# Patient Record
Sex: Female | Born: 1985
Health system: Southern US, Community
[De-identification: ages and names within clinical notes are randomized; demographics above are authoritative.]

## PROBLEM LIST (undated history)

## (undated) ENCOUNTER — Emergency Department (HOSPITAL_BASED_OUTPATIENT_CLINIC_OR_DEPARTMENT_OTHER): Admission: EM | Discharge status: Absent without leave | Payer: 59

## (undated) HISTORY — PX: TUBAL LIGATION: SHX77

---
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
## 2004-01-17 ENCOUNTER — Other Ambulatory Visit: Admission: RE | Admit: 2004-01-17 | Discharge: 2004-01-17 | Payer: Self-pay | Admitting: Obstetrics and Gynecology

## 2004-07-03 ENCOUNTER — Inpatient Hospital Stay (HOSPITAL_COMMUNITY): Admission: AD | Admit: 2004-07-03 | Discharge: 2004-07-03 | Payer: Self-pay | Admitting: Obstetrics and Gynecology

## 2004-07-04 ENCOUNTER — Inpatient Hospital Stay (HOSPITAL_COMMUNITY): Admission: AD | Admit: 2004-07-04 | Discharge: 2004-07-07 | Payer: Self-pay | Admitting: Obstetrics and Gynecology

## 2004-08-05 ENCOUNTER — Other Ambulatory Visit: Admission: RE | Admit: 2004-08-05 | Discharge: 2004-08-05 | Payer: Self-pay | Admitting: Obstetrics & Gynecology

## 2004-11-11 ENCOUNTER — Ambulatory Visit: Payer: Self-pay | Admitting: Family Medicine

## 2005-01-21 ENCOUNTER — Ambulatory Visit: Payer: Self-pay | Admitting: Family Medicine

## 2005-03-21 ENCOUNTER — Ambulatory Visit: Payer: Self-pay | Admitting: Family Medicine

## 2005-11-19 ENCOUNTER — Other Ambulatory Visit: Admission: RE | Admit: 2005-11-19 | Discharge: 2005-11-19 | Payer: Self-pay | Admitting: Obstetrics and Gynecology

## 2005-12-08 ENCOUNTER — Ambulatory Visit (HOSPITAL_COMMUNITY): Admission: RE | Admit: 2005-12-08 | Discharge: 2005-12-08 | Payer: Self-pay | Admitting: Obstetrics and Gynecology

## 2006-03-13 ENCOUNTER — Inpatient Hospital Stay (HOSPITAL_COMMUNITY): Admission: RE | Admit: 2006-03-13 | Discharge: 2006-03-15 | Payer: Self-pay | Admitting: Obstetrics and Gynecology

## 2006-03-13 ENCOUNTER — Encounter (INDEPENDENT_AMBULATORY_CARE_PROVIDER_SITE_OTHER): Payer: Self-pay | Admitting: Specialist

## 2006-06-08 ENCOUNTER — Telehealth (INDEPENDENT_AMBULATORY_CARE_PROVIDER_SITE_OTHER): Payer: Self-pay | Admitting: *Deleted

## 2006-06-09 ENCOUNTER — Ambulatory Visit: Payer: Self-pay | Admitting: Family Medicine

## 2006-12-17 ENCOUNTER — Ambulatory Visit: Payer: Self-pay | Admitting: Obstetrics & Gynecology

## 2006-12-21 ENCOUNTER — Ambulatory Visit (HOSPITAL_COMMUNITY): Admission: RE | Admit: 2006-12-21 | Discharge: 2006-12-21 | Payer: Self-pay | Admitting: Obstetrics & Gynecology

## 2007-01-15 ENCOUNTER — Ambulatory Visit (HOSPITAL_COMMUNITY): Admission: RE | Admit: 2007-01-15 | Discharge: 2007-01-15 | Payer: Self-pay | Admitting: Obstetrics & Gynecology

## 2007-02-10 ENCOUNTER — Ambulatory Visit: Payer: Self-pay | Admitting: Obstetrics & Gynecology

## 2007-02-10 ENCOUNTER — Encounter: Payer: Self-pay | Admitting: Obstetrics and Gynecology

## 2007-02-12 ENCOUNTER — Ambulatory Visit (HOSPITAL_COMMUNITY): Admission: RE | Admit: 2007-02-12 | Discharge: 2007-02-12 | Payer: Self-pay | Admitting: Obstetrics & Gynecology

## 2007-03-03 ENCOUNTER — Ambulatory Visit: Payer: Self-pay | Admitting: *Deleted

## 2007-03-10 ENCOUNTER — Ambulatory Visit: Payer: Self-pay | Admitting: Obstetrics & Gynecology

## 2007-03-17 ENCOUNTER — Ambulatory Visit: Payer: Self-pay | Admitting: Obstetrics & Gynecology

## 2007-03-23 ENCOUNTER — Ambulatory Visit: Payer: Self-pay | Admitting: Gynecology

## 2007-03-23 ENCOUNTER — Encounter (INDEPENDENT_AMBULATORY_CARE_PROVIDER_SITE_OTHER): Payer: Self-pay | Admitting: Gynecology

## 2007-03-23 ENCOUNTER — Inpatient Hospital Stay (HOSPITAL_COMMUNITY): Admission: RE | Admit: 2007-03-23 | Discharge: 2007-03-26 | Payer: Self-pay | Admitting: Gynecology

## 2007-03-31 ENCOUNTER — Telehealth: Payer: Self-pay | Admitting: *Deleted

## 2007-04-16 LAB — CONVERTED CEMR LAB
Pap Smear: NORMAL
Pap Smear: NORMAL
Pap Smear: NORMAL

## 2007-10-19 ENCOUNTER — Ambulatory Visit: Payer: Self-pay | Admitting: Family Medicine

## 2007-10-19 DIAGNOSIS — R300 Dysuria: Secondary | ICD-10-CM

## 2007-10-19 LAB — CONVERTED CEMR LAB
Bilirubin Urine: NEGATIVE
Glucose, Urine, Semiquant: NEGATIVE
Ketones, urine, test strip: NEGATIVE
Specific Gravity, Urine: 1.025
Urobilinogen, UA: 0.2
pH: 6

## 2007-12-24 ENCOUNTER — Ambulatory Visit: Payer: Self-pay | Admitting: Family Medicine

## 2007-12-24 LAB — CONVERTED CEMR LAB
Casts: 0 /lpf
Glucose, Urine, Semiquant: NEGATIVE
Nitrite: NEGATIVE
Protein, U semiquant: NEGATIVE
Urobilinogen, UA: 0.2

## 2008-10-20 ENCOUNTER — Emergency Department (HOSPITAL_COMMUNITY): Admission: EM | Admit: 2008-10-20 | Discharge: 2008-10-20 | Payer: Self-pay | Admitting: Emergency Medicine

## 2009-02-04 ENCOUNTER — Emergency Department (HOSPITAL_COMMUNITY): Admission: EM | Admit: 2009-02-04 | Discharge: 2009-02-04 | Payer: Self-pay | Admitting: Emergency Medicine

## 2009-07-26 IMAGING — US US OB FOLLOW-UP
1 series · 14 of 28 positions shown · non-contrast
Comparison: none

OBSTETRICAL ULTRASOUND:
 This ultrasound was performed in The [HOSPITAL], and the [facility] report will be stored to [REDACTED] PACS.

[Series 1: us ob follow-up · 14 of 30 slices shown]
[im 2/30]
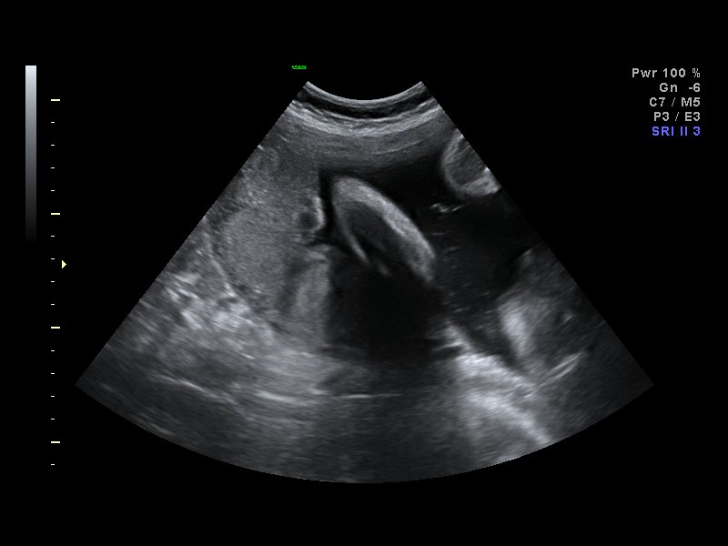
[im 4/30]
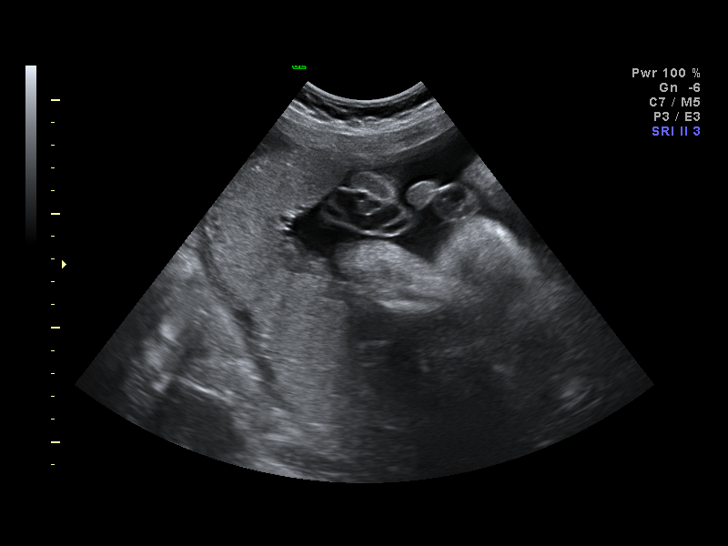
[im 6/30]
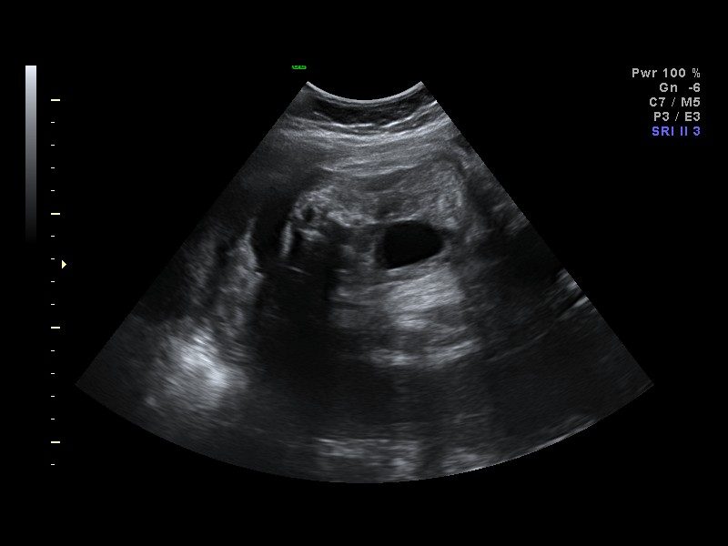
[im 8/30]
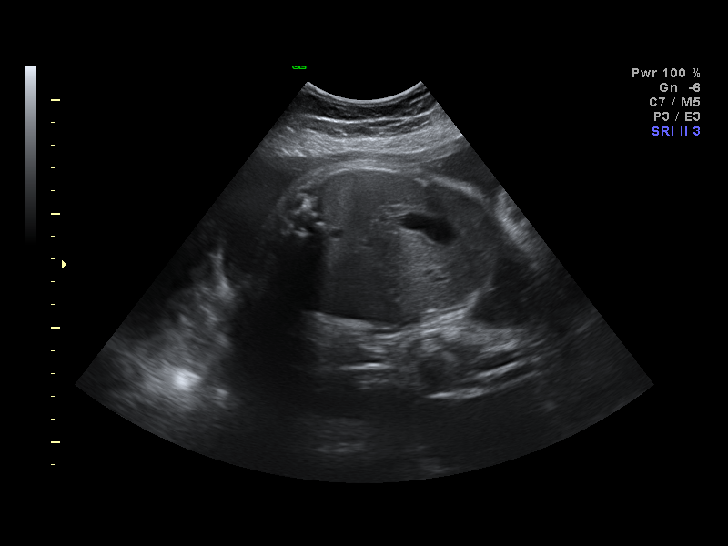
[im 10/30]
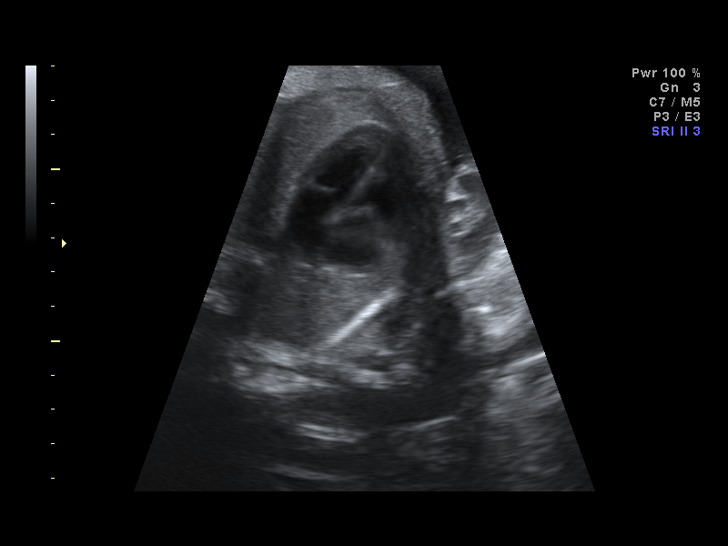
[im 12/30]
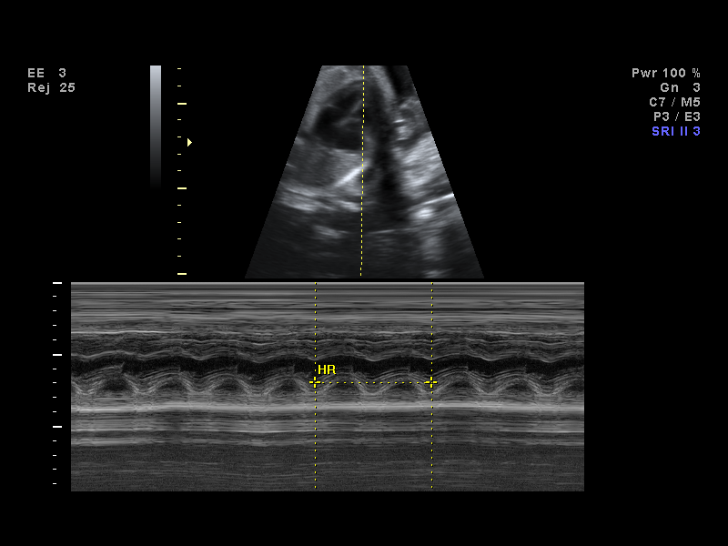
[im 14/30]
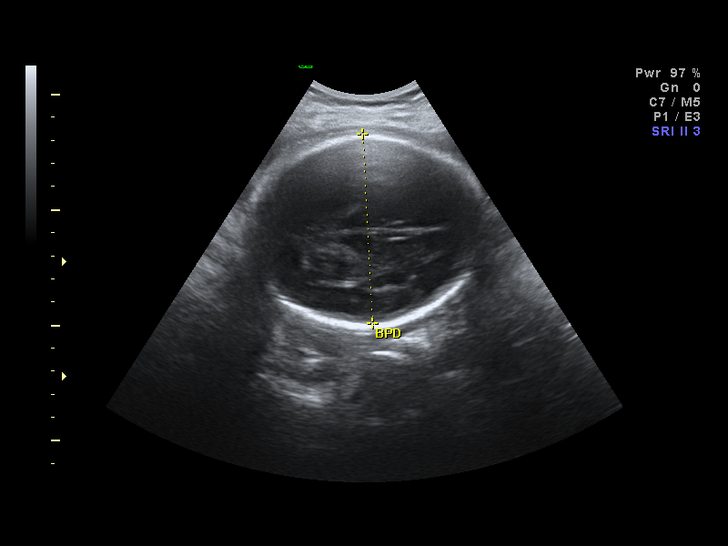
[im 17/30]
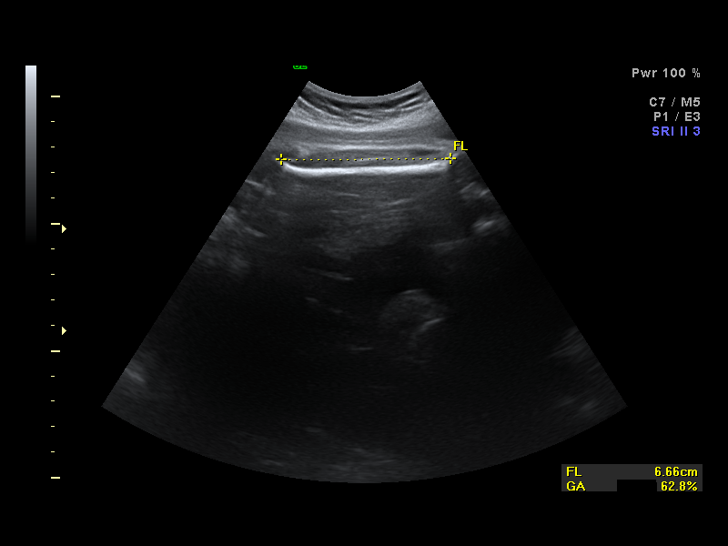
[im 19/30]
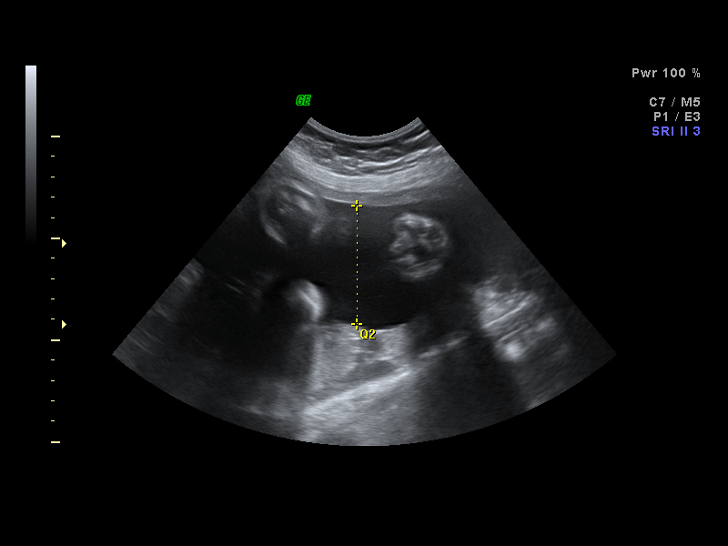
[im 21/30]
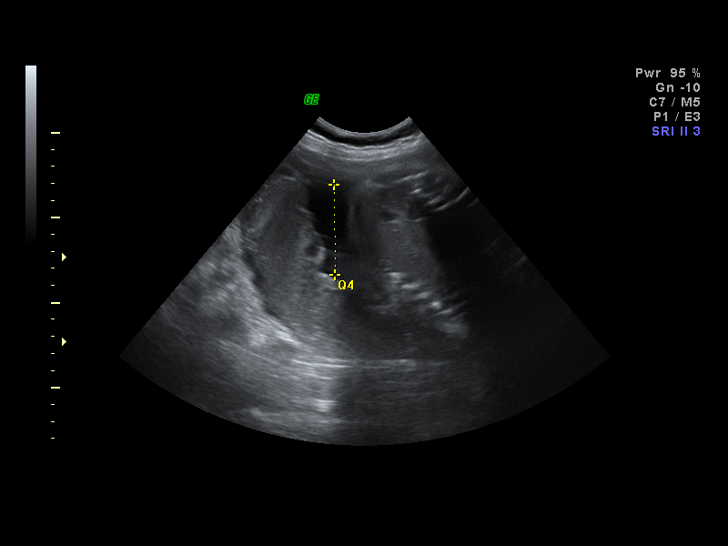
[im 23/30]
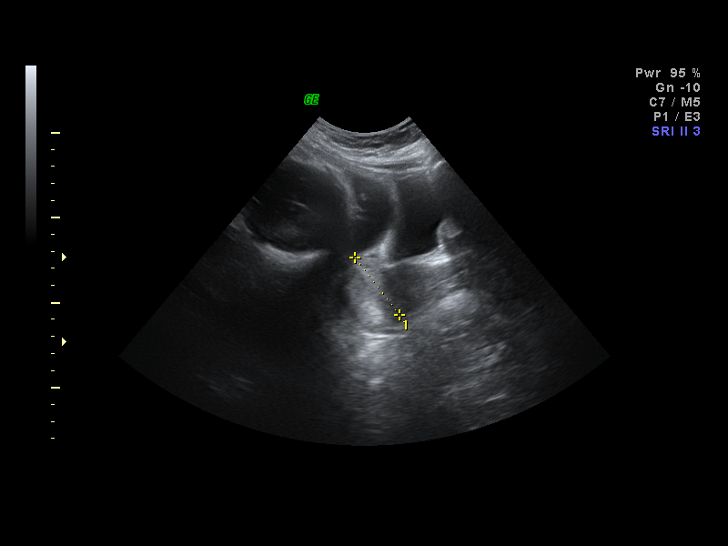
[im 25/30]
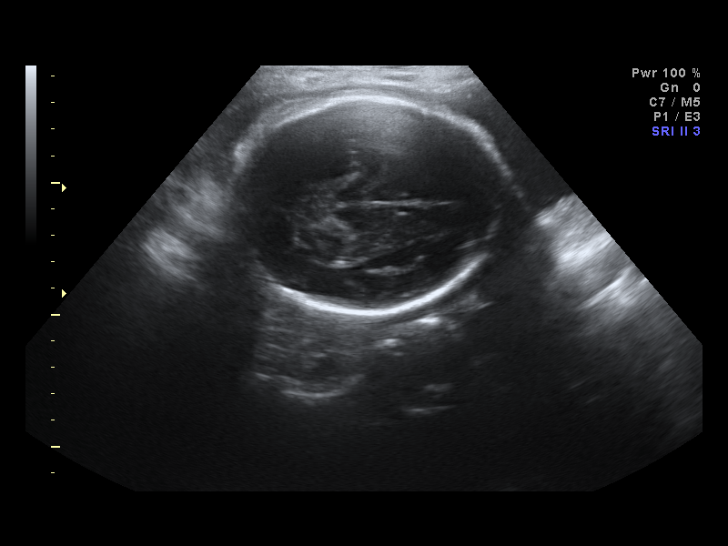
[im 27/30]
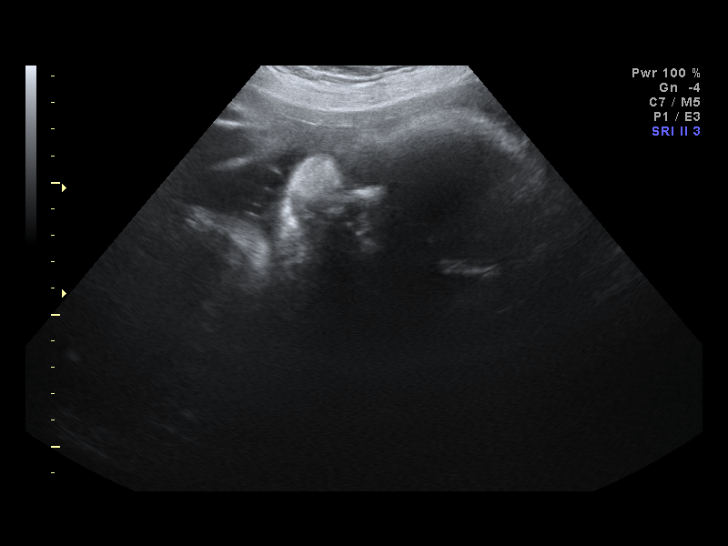
[im 30/30]
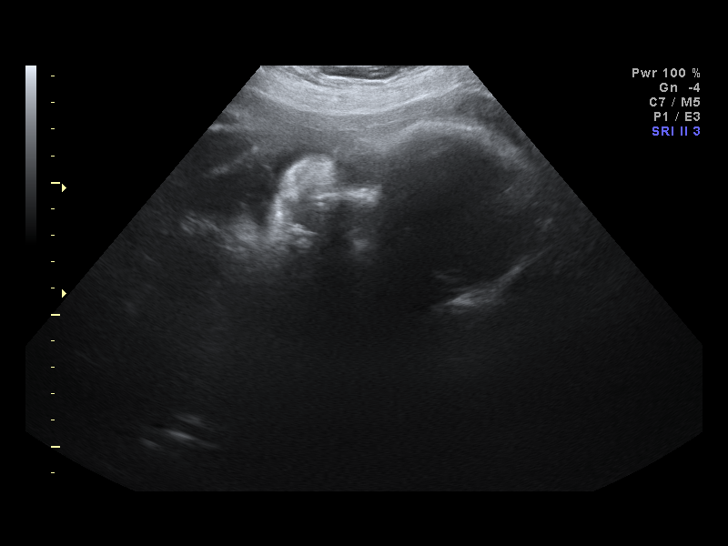

[14 of 28 positions shown; findings below may reference images not displayed]

IMPRESSION: The [facility] report has also been faxed to the ordering physician.

## 2010-03-03 ENCOUNTER — Encounter: Payer: Self-pay | Admitting: *Deleted

## 2010-03-21 ENCOUNTER — Encounter: Payer: Self-pay | Admitting: *Deleted

## 2010-05-17 LAB — POCT RAPID STREP A (OFFICE): Streptococcus, Group A Screen (Direct): POSITIVE — AB

## 2010-06-25 NOTE — Op Note (Signed)
Debra Rosario, Debra Rosario NO.:  [id number]   MEDICAL RECORD NO.:  [id number]          PATIENT TYPE:  WOC   LOCATION:  WOC                          FACILITY:  [facility]   PHYSICIAN:  Ginger Carne, MD  DATE OF BIRTH:  [birth year]   DATE OF PROCEDURE:  03/23/2007  DATE OF DISCHARGE:                               OPERATIVE REPORT   PREOPERATIVE DIAGNOSIS:  Repeat cesarean section and request for  sterilization.   POSTOPERATIVE DIAGNOSIS:  Repeat cesarean section and request for  sterilization.   PROCEDURE:  Repeat low transverse cesarean section and [place] bilateral  tubal ligation.   SURGEON:  Ginger Carne, MD.   ASSISTANTMichele Mcalpine D. Rose, M.D.   ESTIMATED BLOOD LOSS:  700 mL.   COMPLICATIONS:  None immediate.   ANESTHESIA:  Spinal.   SPECIMEN:  Cord bloods and right and left portions of tubes.   FINDINGS:  A term infant female delivered in vertex presentation.  Apgars  and weight per delivery room record, no gross abnormalities.  Baby cried  spontaneously at delivery.  Uterus, tubes and ovaries showed normal  decidual changes of pregnancy.  Placenta was complete, three-vessel  cord, central insertion.   OPERATIVE PROCEDURE:  The patient prepped and draped in usual fashion  and placed in the left lateral supine position.  Betadine solution used  for antiseptic and the patient was catheterized prior to procedure.  After adequate spinal analgesia a Pfannenstiel incision was made and  abdomen opened.  Lower uterine segment incised transversely after  developing the bladder flap.  Baby delivered, cord clamped and cut and  infant given to the pediatric staff after bulb suctioning.  Placenta  removed manually.  Uterus inspected.  Closure uterine musculature in one  layer of 0-0 Vicryl running interlocking suture.  Bleeding points  hemostatically checked.  Blood clots removed.   The tubes were bilaterally identified separate and apart from their  respective  round ligaments.  3 cm of tube were grasped at the isthmus  ampullary junction and 2-0 plain catgut sutures were placed at the base  twice and the tubes  were cut above said knots.  Specimen sent separately to pathology.  No  active bleeding noted.  Closure of the fascia in one layer of 0-0 Vicryl  running suture and skin staples for the skin.  Instrument and sponge  count were correct.  The patient tolerated the procedure well, returned  post anesthesia recovery room in excellent condition.      Ginger Carne, MD  Electronically Signed     SHB/MEDQ  D:  03/23/2007  T:  03/24/2007  Job:  [id number]

## 2010-06-25 NOTE — Discharge Summary (Signed)
NAMESHYNICE, SIGEL                ACCOUNT NO.:  [id number]   MEDICAL RECORD NO.:  [id number]          PATIENT TYPE:  INP   LOCATION:  9106                          [facility]   PHYSICIAN:  Lazaro Arms, M.D.   DATE OF BIRTH:  22-Apr-1985   DATE OF ADMISSION:  03/23/2007  DATE OF DISCHARGE:  03/26/2007                               DISCHARGE SUMMARY   ADMITTING DIAGNOSIS:  Planned repeat cesarean section and bilateral  tubal ligation.   DISCHARGE DIAGNOSIS:  Planned repeat cesarean section and bilateral  tubal ligation.   HOSPITAL COURSE:  The patient was admitted on February 10 for a planned  repeat cesarean section and bilateral tubal ligation at [redacted] weeks  gestation.  Her operative course was uneventful without complication.  Vital signs remained within normal limits.  Blood count is normal except  for some anemia with a hemoglobin of 8.7 and a hematocrit of 25.8.  She  is asymptomatic.   PHYSICAL EXAMINATION:  HEENT:  Within normal limits.  HEART:  Regular rate and rhythm.  LUNGS:  Were clear.  ABDOMEN:  Is soft and nontender.  She is passing flatus, bowel sounds  positive x4 quadrants, staples intact.  Incision is clean and dry  without erythema or drainage.  Legs are negative.  Lochia small.   The patient is breast-feeding.   IMPRESSION:  Postoperative day #3 status post repeat low transverse  cesarean section and BTL.   PLAN:  Remove staples before discharge.   DISCHARGE MEDICATIONS:  1. FeSO4 325 mg p.o. b.i.d.  2. Prenatal vitamins 1 p.o. daily.  3. Ibuprofen 600 mg 1 p.o. q.6h. as needed for pain.  4. Colace 100 mg 1 p.o. b.i.d. as needed for postpartum constipation.  5. Percocet 5/325 one p.o. q.4-6h. p.r.n. pain #20, no refills.     Jacklyn Shell, C.N.M.      Lazaro Arms, M.D.  Electronically Signed   FC/MEDQ  D:  03/26/2007  T:  03/27/2007  Job:  [id number]

## 2010-06-28 NOTE — Discharge Summary (Signed)
Debra Rosario, AUTHIER                ACCOUNT NO.:  [id number]   MEDICAL RECORD NO.:  [id number]          PATIENT TYPE:  INP   LOCATION:  9133                          [facility]   PHYSICIAN:  Ilda Mori, M.D.   DATE OF BIRTH:  04-Nov-1985   DATE OF ADMISSION:  07/04/2004  DATE OF DISCHARGE:  07/07/2004                                 DISCHARGE SUMMARY   FINAL DIAGNOSES:  Intrauterine pregnancy at term, active labor.  Decreased  amniotic fluid volume with an unengaged vertex at term and a primigravida as  labor failure to progress.  Meconium stained amniotic fluid.   PROCEDURES:  Low transverse cesarean section.   SURGEON:  Gerrit Friends. Aldona Bar, M.D.   COMPLICATIONS:  None.   HISTORY OF PRESENT ILLNESS:  This 25 -year-old G1, P0 as admitted at 40 plus  weeks gestation in early labor.  The patient had a positive Group B strep  culture.  Came in the office.  Was started on some antibiotics.  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  At this point, the patient as in  early labor.  She was admitted on the morning of May 25, when her cervix was  about 2 cm dilated, 50% effaced, and the vertex was totally unengaged.  She  did not wish Pitocin augmentation at this time, and vertex was too high for  amniotomy.  The patient was observed for a couple of hours until around noon  where the AFI was reported to be at about 8.3.  The BPP on May 24 was 8/8,  and this was done for a nonreassuring NST, but today, the amniotic fluid was  8.7, and on reevaluation of the cervix, there was no change.  After a long  discussion with the patient, it is decided to begin low dose Pitocin.  At  about 6 p.m., the patient's cervix was still about 2 cm dilated, about 70%  effaced and the vertex was still very high.  The patient was contracting  pretty  well in the Pitocin.  A discussion was held with the patient  regarding her option, and at this point a decision was made to proceed with  a cesarean section secondary to an unengaged primigravida at term, failure  to progress and decreased amniotic fluid.  The patient was taken to the  operating room on Jul 04, 2004, by Dr. Annamaria Helling where a primary low  transverse cesarean section was performed with delivery of a 6 pound 7 ounce  female infant with Apgars of 8 and 9.  Delivery without complications.  Patient's postoperative course was benign without significant fevers.  She  was felt ready for discharge on postoperative day #3.  She was sent home on  a regular diet, told to decrease activities.  Told to  continue her prenatal vitamins.  Was given Tylox #25 1-2 q.4h. p.r.n. pain.  Told she could use over-the-counter Motrin up  to 600 mg q.6h. p.r.n. pain.  Was to follow up in the office in four weeks.   DISCHARGE LABORATORIES:  Hemoglobin 10.3, white blood cell count 10.8.       MB/MEDQ  D:  07/22/2004  T:  07/22/2004  Job:  [id number]

## 2010-06-28 NOTE — Op Note (Signed)
Debra, Rosario                ACCOUNT NO.:  [id number]   MEDICAL RECORD NO.:  [id number]          PATIENT TYPE:  INP   LOCATION:  9133                          [facility]   PHYSICIAN:  Gerrit Friends. Aldona Bar, M.D.   DATE OF BIRTH:  1985-07-03   DATE OF PROCEDURE:  07/04/2004  DATE OF DISCHARGE:                                 OPERATIVE REPORT   PREOPERATIVE DIAGNOSES:  1.  Term pregnancy, labor with decreased amniotic fluid volume, unengaged      vertex at term.  2.  Primigravida in labor.  3.  Failure to progress.   POSTOPERATIVE DIAGNOSES:  1.  Term pregnancy, labor with decreased amniotic fluid volume, unengaged      vertex at term.  2.  Primigravida in labor.  3.  Failure to progress.  4.  Delivery of 6 pounds 7 ounce female infant, Apgars 8 and 9.  5.  Thin meconium-stained amniotic fluid.   PROCEDURE:  Primary low transverse cesarean section.   SURGEON:  Gerrit Friends. Aldona Bar, M.D.   ANESTHESIA:  Spinal, Dr. Arby Barrette.   HISTORY:  This 25 year old gravida 1, para 0, was admitted at 40+ weeks in  possible early labor.  Her strep culture was positive.  Her pregnancy was  benign with the exception of excess weight gain.  When I examined the  patient at 8 a.m. on the morning of May 25, her cervix was 1-2 cm dilated,  50% effaced and the vertex was totally unengaged. -3 to -4 station.  It was  difficult to feel the cervix until the patient actually had emptied her  bladder, and then it was still difficult but possible.  The patient did not  wish Pitocin augmentation at this time, and the vertex was too high to carry  out amniotomy.  The patient was observed for three to four hours and  reevaluated at around noon.  An AFI was done and reported at 8.3.  Apparently the patient had a biophysical profile on May 24 for a nonreactive  nonstress test in the office and the biophysical profile was 8/8, but the  amniotic fluid volume was noted to be 8.7, believe.  On reevaluation of the  patient's cervix, there was no real change.  The vertex was still  essentially floating.  After a long discussion with the patient, it was  decided to begin low-dose Pitocin and continue observation.  I was reluctant  to send  her home because of the decreased amniotic fluid volume. When  reassessed at approximately 6 p.m. on May 25, her cervix was now 2 cm  dilated, had thinned out to about 70% effacement, but the vertex was still  very high and the patient was contracting fairly well with Pitocin.  A long  discussion was carried out with the patient and her family at this time and  after all options were given and discussed, the patient opted for cesarean  section for delivery.  She is now taken to the operating room for cesarean  section with the primary diagnosis being an unengaged primigravida at term,  failure to  progress, and decreased amniotic fluid volume.   PROCEDURE:  The patient was taken to the operating room, where after  satisfactory induction of spinal anesthetic by Dr. Arby Barrette she was prepped  and draped, having been placed in the supine position slightly tilted to the  left.  A Foley catheter was placed as part of the prep.  After the patient  was adequately draped and good anesthetic levels were documented, the  procedure was begun.  A Pfannenstiel incision was made with minimal  difficulty dissected down sharply to and through the fascia in a low  transverse fashion with hemostasis created each layer.  The fascia was  incised in the low transverse fashion, subfascial space was created  inferiorly and superiorly, muscles separated in the midline, and the  peritoneum identified and entered appropriately with care taken to avoid the  bowel superiorly and the bladder inferiorly.  At this time the vesicouterine  peritoneum was incised a low transverse fashion and pushed off the lower  uterine segment with ease.  Sharp to the uterus at this time was carried out  in a low  transverse fashion with the Metzenbaum scissors.  Amniotomy  produced with production of thin meconium.  At this time the vertex was  still floating but with minimal difficulty was delivered, as was the baby,  subsequent weight found to be 6 pounds 7 ounces and Apgars were assigned at  8 and 9.  The infant was subsequently taken to the nursery in good condition  and after the cord was clamped and cut and cord bloods were collected, the  placenta was delivered intact.  There was a three-vessel cord.  At this time  the uterus was exteriorized, rendered free of any remaining products  conception, in good uterine contractility was afforded with slowly-given  intravenous Pitocin and manual stimulation.  Tubes and ovaries appeared  normal.  At this time the uterus was closed in two layers, the first being  #1 Vicryl in a running locking fashion, the second being #1 Vicryl in an  interrupted figure-of-eight fashion.  The bladder flap was reapproximated  using 0 Vicryl in interrupted fashion, and at this time the abdomen lavaged  of all free blood and clot in the uterus placed into the abdominal cavity.  All counts were noted to be correct at this time and no foreign bodies were  noted to be remaining in the abdominal cavity.  Closure of the abdomen was carried out at this time in layers.  The  abdominal peritoneum was closed with 0 Vicryl in a running fashion, as was  were the muscles reapproximated.  Assured of good subfascial hemostasis, the  fascia was then reapproximated using 0 Vicryl from angle to midline  bilaterally.  Subcutaneous tissue was rendered hemostatic and staples were  then used to close the skin.  A sterile pressure dressing was applied and the patient at this time of  transported to the recovery room in satisfactory condition, having tolerated  the procedure well.  Estimated blood loss 510 mL.  All counts correct x2.  In summary, this patient presented as an unengaged  primigravida at term in  labor and did not progress in descent and opted for delivery by cesarean  section after discussion of all the options.  She was delivered of a 6  pounds 7 ounce female infant with Apgars of 8 and 9.  There was meconium-  stained amniotic fluid, and the amniotic fluid was reduced.  At the  conclusion  of the procedure, both mother and baby were doing well in their  respective recovery areas.      RMW/MEDQ  D:  07/04/2004  T:  07/05/2004  Job:  [id number]

## 2010-06-28 NOTE — Discharge Summary (Signed)
NAMEMARYHELEN, LINDLER                ACCOUNT NO.:  [id number]   MEDICAL RECORD NO.:  [id number]          PATIENT TYPE:  INP   LOCATION:  9130                          [facility]   PHYSICIAN:  James A. Ashley Royalty, M.D.DATE OF BIRTH:  [birth year]   DATE OF ADMISSION:  03/13/2006  DATE OF DISCHARGE:  03/15/2006                               DISCHARGE SUMMARY   DISCHARGE DIAGNOSES:  1. Intrauterine pregnancy at 39 weeks, delivered.  2. Previous cesarean section.   OPERATIONS/SPECIAL PROCEDURES:  Repeat low transverse cesarean section.   CONSULTATIONS:  None.   DISCHARGE MEDICATIONS:  Percocet, Motrin 600 mg, vitamins.   HISTORY AND PHYSICAL:  This is a 25 year old, gravida 2, para 1, at [redacted]  weeks gestation.  Previous cesarean section.  Noncompliance with OB/GYN  care.  Patient had Chlamydia positive early in the pregnancy, which was  successfully treated.  For the remainder of the history, please see the  chart.   HOSPITAL COURSE:  The patient was admitted to [facility]  [place].  Admission laboratory studies were drawn.  On March 13, 2006, she was taken to the operating room and underwent repeat low  transverse cesarean section.  The procedure yielded a 6 pound, 12 ounce  female, Apgar's 9 at one minute and 9 at five minutes, sent to the newborn  nursery.  The patient's postoperative course was benign.  At her  request, she was discharged on the second postoperative day afebrile and  in satisfactory condition.   DISPOSITION:  Patient is to return to the office in the next one to two  days to remove residual staples and, of course, in six weeks for her  postpartum visit.      James A. Ashley Royalty, M.D.  Electronically Signed     JAM/MEDQ  D:  03/15/2006  T:  03/15/2006  Job:  [id number]

## 2010-06-28 NOTE — Op Note (Signed)
Debra Rosario, Debra Rosario                ACCOUNT NO.:  [id number]   MEDICAL RECORD NO.:  [id number]          PATIENT TYPE:  INP   LOCATION:  9130                          [facility]   PHYSICIAN:  James A. Ashley Royalty, M.D.DATE OF BIRTH:  [birth year]   DATE OF PROCEDURE:  03/13/2006  DATE OF DISCHARGE:                               OPERATIVE REPORT   PREOPERATIVE DIAGNOSES:  1. Intrauterine pregnancy at 48 weeks' gestation.  2. Previous cesarean section.   POSTOPERATIVE DIAGNOSES:  1. Intrauterine pregnancy at 74 weeks' gestation.  2. Previous cesarean section.   PROCEDURE:  Repeat low transverse cesarean section.   SURGEON:  Rudy Jew. Ashley Royalty, M.D.   ANESTHESIA:  Spinal.   FINDINGS:  A 6 pound 12 ounce female, Apgars 9 at one minute and 9 at five  minutes, sent to the newborn nursery.   ESTIMATED BLOOD LOSS:  700 mL.   COMPLICATIONS:  None.   PACKS DRAINS:  Foley.   Sponge, needle and instrument count were reported as correct x2.   PROCEDURE:  The patient was taken to the operating room and  placed in a  sitting position.  After a spinal anesthetic was administered, she was  placed in the dorsal supine position and prepped and draped in the usual  manner for abdominal surgery.  A Foley catheter was placed.  A  Pfannenstiel incision was made down to level the fascia, which was  nicked with a knife, incised transversely with Mayo scissors.  The  underlying rectus muscles were separated from the fascia using sharp and  blunt dissection.  The rectus muscles were separated in the midline  exposing the peritoneum, which was elevated with hemostats and entered  atraumatically with Metzenbaum scissors.  The incision was extended  longitudinally.  The uterus was identified and a bladder flap created by  incising the anterior uterine serosa and sharply and bluntly dissecting  the bladder inferiorly.  It was held in place with a bladder blade.  The  uterus was then entered through a low  transverse incision using sharp  and blunt dissection.  The fluid was noted to be clear.  The infant was  delivered from a vertex presentation in an atraumatic manner.  There was  a nuchal cord x1.  The infant was suctioned.  The cord was doubly  clamped, cut, and the infant given immediately to the awaiting  pediatrics team.  Next the placenta and membranes were removed in their  entirety and submitted to pathology for histologic studies.  The uterus  was exteriorized.  The uterus was then closed in two running layers with  #1 Vicryl.  The first was a running locking layer.  The second was a  running, intermittently locking, and imbricating layer.  One additional  figure-of-eight suture was required to obtain hemostasis.  Hemostasis  was noted.  The uterus, tubes and ovaries were inspected and found to be  normal and were returned to the abdominal cavity.  Irrigation was  accomplished and hemostasis once again noted.  The peritoneum was then  closed with 3-0 Vicryl in a running fashion.  The deep fascia was closed  with 0 Vicryl in a running fashion.  The skin was closed with staples.  At the conclusion of the procedure, the urine was clear and copious.   The patient was then taken to the recovery room in excellent condition.      James A. Ashley Royalty, M.D.  Electronically Signed     JAM/MEDQ  D:  03/13/2006  T:  03/13/2006  Job:  [id number]

## 2010-10-31 LAB — POCT URINALYSIS DIP (DEVICE)
Glucose, UA: NEGATIVE
Glucose, UA: NEGATIVE
Ketones, ur: NEGATIVE
Ketones, ur: NEGATIVE
Operator id: 120861
Operator id: 297281
Protein, ur: NEGATIVE
Protein, ur: NEGATIVE
Specific Gravity, Urine: 1.02
Specific Gravity, Urine: 1.02
Urobilinogen, UA: 0.2

## 2010-11-01 LAB — CBC
Platelets: 190
Platelets: 229
RBC: 4.06
RDW: 13.9
WBC: 11.6 — ABNORMAL HIGH
WBC: 12.2 — ABNORMAL HIGH

## 2010-11-01 LAB — POCT URINALYSIS DIP (DEVICE)
Glucose, UA: NEGATIVE
Ketones, ur: NEGATIVE
Operator id: 148111
Protein, ur: 30 — AB
Specific Gravity, Urine: 1.02
Urobilinogen, UA: 0.2

## 2010-11-01 LAB — TYPE AND SCREEN
ABO/RH(D): O POS
Antibody Screen: NEGATIVE

## 2010-11-01 LAB — RPR: RPR Ser Ql: NONREACTIVE

## 2010-11-15 LAB — POCT URINALYSIS DIP (DEVICE)
Bilirubin Urine: NEGATIVE
Glucose, UA: NEGATIVE
Ketones, ur: NEGATIVE
Nitrite: NEGATIVE
Operator id: 297281
pH: 7

## 2012-04-17 ENCOUNTER — Encounter (HOSPITAL_BASED_OUTPATIENT_CLINIC_OR_DEPARTMENT_OTHER): Payer: Self-pay

## 2012-04-17 DIAGNOSIS — R079 Chest pain, unspecified: Secondary | ICD-10-CM | POA: Insufficient documentation

## 2012-04-17 MED ORDER — ASPIRIN 81 MG PO CHEW: 162.0000 mg | CHEWABLE_TABLET | Freq: Once | ORAL | Status: DC

## 2012-04-17 NOTE — ED Notes (Signed)
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 Reports that she has been seen in past for same and no diagnosis made. Denies cold and cough symptoms

## 2012-04-18 ENCOUNTER — Emergency Department (HOSPITAL_BASED_OUTPATIENT_CLINIC_OR_DEPARTMENT_OTHER): Admit: 2012-04-18 | Payer: Self-pay

## 2012-04-18 ENCOUNTER — Emergency Department (HOSPITAL_BASED_OUTPATIENT_CLINIC_OR_DEPARTMENT_OTHER)
Admission: EM | Admit: 2012-04-18 | Discharge: 2012-04-18 | Discharge status: Against Medical Advice | Payer: Medicaid Other | Attending: Emergency Medicine | Admitting: Emergency Medicine

## 2012-04-18 NOTE — ED Notes (Signed)
Patient called back to triage room to have labs drawn as ordered. Patient refused and signed out AMA

## 2014-05-16 ENCOUNTER — Other Ambulatory Visit (HOSPITAL_COMMUNITY)
Admission: RE | Admit: 2014-05-16 | Discharge: 2014-05-16 | Disposition: A | Discharge status: Home / Self Care | Payer: 59 | Source: Ambulatory Visit | Attending: Family Medicine | Admitting: Family Medicine

## 2014-05-16 ENCOUNTER — Other Ambulatory Visit: Payer: Self-pay | Admitting: Physician Assistant

## 2014-05-16 DIAGNOSIS — Z124 Encounter for screening for malignant neoplasm of cervix: Secondary | ICD-10-CM | POA: Insufficient documentation

## 2014-05-18 LAB — CYTOLOGY - PAP

## 2015-07-23 ENCOUNTER — Emergency Department (HOSPITAL_BASED_OUTPATIENT_CLINIC_OR_DEPARTMENT_OTHER)
Admission: EM | Admit: 2015-07-23 | Discharge: 2015-07-23 | Disposition: A | Discharge status: Home / Self Care | Payer: 59 | Attending: Emergency Medicine | Admitting: Emergency Medicine

## 2015-07-23 ENCOUNTER — Encounter (HOSPITAL_BASED_OUTPATIENT_CLINIC_OR_DEPARTMENT_OTHER): Payer: Self-pay | Admitting: Emergency Medicine

## 2015-07-23 DIAGNOSIS — N390 Urinary tract infection, site not specified: Secondary | ICD-10-CM | POA: Insufficient documentation

## 2015-07-23 LAB — URINALYSIS, ROUTINE W REFLEX MICROSCOPIC
Bilirubin Urine: NEGATIVE
Glucose, UA: NEGATIVE mg/dL
Hgb urine dipstick: NEGATIVE
KETONES UR: NEGATIVE mg/dL
NITRITE: POSITIVE — AB
PH: 6 (ref 5.0–8.0)
Protein, ur: NEGATIVE mg/dL
Specific Gravity, Urine: 1.018 (ref 1.005–1.030)

## 2015-07-23 LAB — URINE MICROSCOPIC-ADD ON

## 2015-07-23 MED ORDER — CEPHALEXIN 500 MG PO CAPS: 500.0000 mg | ORAL_CAPSULE | Freq: Four times a day (QID) | ORAL | Status: DC

## 2015-07-23 MED FILL — CEPHALEXIN 500 MG CAPSULE: 500 | 10 days supply | Qty: 40 | Fill #0

## 2015-07-23 NOTE — Discharge Instructions (Signed)

## 2015-07-23 NOTE — ED Provider Notes (Signed)
CSN: [id number]     Arrival date & time 07/23/15  1023 History   First MD Initiated Contact with Patient 07/23/15 1025     Chief Complaint  Patient presents with  . Urinary Frequency     (Consider location/radiation/quality/duration/timing/severity/associated sxs/prior Treatment) Patient is a 30 y.o. female presenting with frequency. The history is provided by the patient. No language interpreter was used.  Urinary Frequency This is a new problem. The current episode started today. The problem occurs constantly. The problem has been gradually worsening. Nothing aggravates the symptoms. She has tried nothing for the symptoms.    History reviewed. No pertinent past medical history. History reviewed. No pertinent past surgical history. History reviewed. No pertinent family history. Social History  Substance Use Topics  . Smoking status: Never Smoker   . Smokeless tobacco: None  . Alcohol Use: Yes     Comment: occ   OB History    No data available     Review of Systems  Genitourinary: Positive for frequency.  All other systems reviewed and are negative.     Allergies  Review of patient's allergies indicates no known allergies.  Home Medications   Prior to Admission medications   Not on File   BP 130/66 mmHg  Pulse 100  Temp(Src) 98.3 F (36.8 C) (Oral)  Resp 18  Ht 5\' 3"  (1.6 m)  Wt 81.647 kg  BMI 31.89 kg/m2  SpO2 98%  LMP 06/29/2015 Physical Exam  Constitutional: She is oriented to person, place, and time. She appears well-developed and well-nourished.  HENT:  Head: Normocephalic.  Eyes: EOM are normal.  Neck: Normal range of motion.  Pulmonary/Chest: Effort normal.  Abdominal: She exhibits no distension.  Musculoskeletal: Normal range of motion.  Neurological: She is alert and oriented to person, place, and time.  Psychiatric: She has a normal mood and affect.  Nursing note and vitals reviewed.   ED Course  Procedures (including critical care  time) Labs Review Labs Reviewed  URINALYSIS, ROUTINE W REFLEX MICROSCOPIC (NOT AT [facility]) - Abnormal; Notable for the following:    APPearance CLOUDY (*)    Nitrite POSITIVE (*)    Leukocytes, UA SMALL (*)    All other components within normal limits  URINE MICROSCOPIC-ADD ON - Abnormal; Notable for the following:    Squamous Epithelial / LPF 6-30 (*)    Bacteria, UA MANY (*)    All other components within normal limits    Imaging Review No results found. I have personally reviewed and evaluated these images and lab results as part of my medical decision-making.   EKG Interpretation None      MDM urine shows many bacteria.   I will treat    Final diagnoses:  UTI (lower urinary tract infection)   Meds ordered this encounter  Medications  . cephALEXin (KEFLEX) 500 MG capsule    Sig: Take 1 capsule (500 mg total) by mouth 4 (four) times daily.    Dispense:  40 capsule    Refill:  0    Order Specific Question:  Supervising Provider    Answer:  Eber HongMILLER, BRIAN [[id number]]   An After Visit Summary was printed and given to the patient.    Lonia SkinnerLeslie K ProsperitySofia, PA-C 07/23/15 1403  Pricilla LovelessScott Goldston, MD 07/23/15 [phone]

## 2015-07-23 NOTE — ED Notes (Signed)
Patient states that she is having urinary frequency when she drinks a lot of water / coffee. Also reports that the Urine has a foul odor and is dark .

## 2016-12-01 ENCOUNTER — Ambulatory Visit (HOSPITAL_COMMUNITY)
Admission: EM | Admit: 2016-12-01 | Discharge: 2016-12-01 | Disposition: A | Discharge status: Home / Self Care | Payer: Managed Care, Other (non HMO) | Attending: Internal Medicine | Admitting: Internal Medicine

## 2016-12-01 ENCOUNTER — Encounter (HOSPITAL_COMMUNITY): Payer: Self-pay | Admitting: Emergency Medicine

## 2016-12-01 DIAGNOSIS — Z3202 Encounter for pregnancy test, result negative: Secondary | ICD-10-CM | POA: Diagnosis not present

## 2016-12-01 DIAGNOSIS — R102 Pelvic and perineal pain: Secondary | ICD-10-CM

## 2016-12-01 DIAGNOSIS — N3000 Acute cystitis without hematuria: Secondary | ICD-10-CM | POA: Diagnosis not present

## 2016-12-01 LAB — POCT URINALYSIS DIP (DEVICE)
Bilirubin Urine: NEGATIVE
GLUCOSE, UA: NEGATIVE mg/dL
Hgb urine dipstick: NEGATIVE
KETONES UR: NEGATIVE mg/dL
Nitrite: POSITIVE — AB
PROTEIN: NEGATIVE mg/dL
SPECIFIC GRAVITY, URINE: 1.02 (ref 1.005–1.030)
UROBILINOGEN UA: 1 mg/dL (ref 0.0–1.0)
pH: 7 (ref 5.0–8.0)

## 2016-12-01 LAB — POCT PREGNANCY, URINE: PREG TEST UR: NEGATIVE

## 2016-12-01 MED ORDER — CIPROFLOXACIN HCL 250 MG PO TABS: 250.0000 mg | ORAL_TABLET | Freq: Two times a day (BID) | ORAL | 0 refills | Status: AC

## 2016-12-01 MED ORDER — FLUCONAZOLE 150 MG PO TABS: 150.0000 mg | ORAL_TABLET | Freq: Once | ORAL | 0 refills | Status: AC

## 2016-12-01 NOTE — ED Provider Notes (Signed)
[facility]    CSN: [id number] Arrival date & time: 12/01/16  1813     History   Chief Complaint Chief Complaint  Patient presents with  . Pelvic Pain    HPI Debra Rosario is a 31 y.o. female.   Subjective:  Debra Rosario is a 31 y.o. female who complains of abnormal smelling urine and frequency for 2 days. Patient denies back pain, flank pain, abdominal pain, fever, vaginal discharge, nausea or vomiting. Patient does not have a history of recurrent UTI.  Patient does not have a history of pyelonephritis.  The following portions of the patient's history were reviewed and updated as appropriate: allergies, current medications, past family history, past medical history, past social history, past surgical history and problem list.       History reviewed. No pertinent past medical history.  Patient Active Problem List   Diagnosis Date Noted  . DYSURIA 10/19/2007    History reviewed. No pertinent surgical history.  OB History    No data available       Home Medications    Prior to Admission medications   Medication Sig Start Date End Date Taking? Authorizing Provider  cephALEXin (KEFLEX) 500 MG capsule Take 1 capsule (500 mg total) by mouth 4 (four) times daily. 07/23/15   Elson Areas, PA-C    Family History No family history on file.  Social History Social History  Substance Use Topics  . Smoking status: Never Smoker  . Smokeless tobacco: Not on file  . Alcohol use Yes     Comment: occ     Allergies   Patient has no known allergies.   Review of Systems Review of Systems  Constitutional: Negative for chills and fever.  Genitourinary: Positive for dysuria and frequency. Negative for vaginal discharge.  Musculoskeletal: Negative for back pain.  All other systems reviewed and are negative.    Physical Exam Triage Vital Signs ED Triage Vitals  Enc Vitals Group     BP 12/01/16 1853 120/63     Pulse Rate 12/01/16 1853 80   Resp 12/01/16 1853 16     Temp 12/01/16 1853 98.1 F (36.7 C)     Temp src --      SpO2 12/01/16 1853 98 %     Weight --      Height --      Head Circumference --      Peak Flow --      Pain Score 12/01/16 1855 1     Pain Loc --      Pain Edu? --      Excl. in GC? --    No data found.   Updated Vital Signs BP 120/63   Pulse 80   Temp 98.1 F (36.7 C)   Resp 16   LMP 11/10/2016   SpO2 98%   Visual Acuity Right Eye Distance:   Left Eye Distance:   Bilateral Distance:    Right Eye Near:   Left Eye Near:    Bilateral Near:     Physical Exam  Constitutional: She is oriented to person, place, and time. She appears well-developed and well-nourished.  Neck: Normal range of motion.  Cardiovascular: Normal rate and regular rhythm.   Pulmonary/Chest: Effort normal and breath sounds normal.  Abdominal:  No CVA tenderness  Musculoskeletal: Normal range of motion.  Neurological: She is alert and oriented to person, place, and time.  Skin: Skin is warm and dry.  Psychiatric: She has a  normal mood and affect.     UC Treatments / Results  Labs (all labs ordered are listed, but only abnormal results are displayed) Labs Reviewed  POCT URINALYSIS DIP (DEVICE) - Abnormal; Notable for the following:       Result Value   Nitrite POSITIVE (*)    Leukocytes, UA TRACE (*)    All other components within normal limits  POCT PREGNANCY, URINE    EKG  EKG Interpretation None       Radiology No results found.  Procedures Procedures (including critical care time)  Medications Ordered in UC Medications - No data to display   Initial Impression / Assessment and Plan / UC Course  I have reviewed the triage vital signs and the nursing notes.  Pertinent labs & imaging results that were available during my care of the patient were reviewed by me and considered in my medical decision making (see chart for details).     31 year old presented with UTI type symptoms. UA  positive for UTI. Patient looks well and is nontoxic appearing. Will start antibiotics. We'll also provide Diflucan for potential yeast infection as patient has had this complication after antibiotics in the past.  Discussed diagnosis and treatment with patient. All questions have been answered and all concerns have been addressed. The patient verbalized understanding and had no further questions   Final Clinical Impressions(s) / UC Diagnoses   Final diagnoses:  Acute cystitis without hematuria    New Prescriptions New Prescriptions   No medications on file     Controlled Substance Prescriptions [place] Controlled Substance Registry consulted? Not Applicable   Lurline IdolMurrill, Lory Nowaczyk, [place]NP 12/01/16 [phone]

## 2016-12-01 NOTE — ED Triage Notes (Signed)
Pt states she thinks she has a UTI, c/o odor, pelvic cramping.

## 2017-10-27 ENCOUNTER — Emergency Department (HOSPITAL_BASED_OUTPATIENT_CLINIC_OR_DEPARTMENT_OTHER)
Admission: EM | Admit: 2017-10-27 | Discharge: 2017-10-27 | Disposition: A | Discharge status: Home / Self Care | Payer: Managed Care, Other (non HMO) | Attending: Emergency Medicine | Admitting: Emergency Medicine

## 2017-10-27 ENCOUNTER — Other Ambulatory Visit: Payer: Self-pay

## 2017-10-27 ENCOUNTER — Encounter (HOSPITAL_BASED_OUTPATIENT_CLINIC_OR_DEPARTMENT_OTHER): Payer: Self-pay | Admitting: *Deleted

## 2017-10-27 DIAGNOSIS — R300 Dysuria: Secondary | ICD-10-CM | POA: Diagnosis present

## 2017-10-27 DIAGNOSIS — N390 Urinary tract infection, site not specified: Secondary | ICD-10-CM | POA: Diagnosis not present

## 2017-10-27 LAB — URINALYSIS, MICROSCOPIC (REFLEX)

## 2017-10-27 LAB — URINALYSIS, ROUTINE W REFLEX MICROSCOPIC
BILIRUBIN URINE: NEGATIVE
Glucose, UA: NEGATIVE mg/dL
HGB URINE DIPSTICK: NEGATIVE
Ketones, ur: NEGATIVE mg/dL
NITRITE: POSITIVE — AB
Protein, ur: NEGATIVE mg/dL
pH: 6 (ref 5.0–8.0)

## 2017-10-27 LAB — PREGNANCY, URINE: Preg Test, Ur: NEGATIVE

## 2017-10-27 MED ORDER — FLUCONAZOLE 150 MG PO TABS: ORAL_TABLET | ORAL | 0 refills | Status: DC

## 2017-10-27 MED ORDER — CEPHALEXIN 500 MG PO CAPS: 500.0000 mg | ORAL_CAPSULE | Freq: Two times a day (BID) | ORAL | 0 refills | Status: DC

## 2017-10-27 MED FILL — CEPHALEXIN 500 MG CAPSULE: 500 | 7 days supply | Qty: 14 | Fill #0

## 2017-10-27 MED FILL — FLUCONAZOLE 150 MG TABS: 150 | 2 days supply | Qty: 2 | Fill #0

## 2017-10-27 NOTE — ED Triage Notes (Signed)
C/o  painful freq urination and odor x 3 days

## 2017-10-27 NOTE — Discharge Instructions (Signed)
Take Keflex until completed.  A urine culture will be sent of your urine and you will be called if the antibiotic we started today will not be sufficient for treatment.  Take Diflucan if you develop symptoms of a yeast infection.  If your symptoms have not resolved in 72 hours, take the second tablet.  Please return the emergency department if you develop any new or worsening symptoms including pain over your kidneys, fever over 100.4, severe nausea vomiting, or any other concerning symptom.

## 2017-10-27 NOTE — ED Provider Notes (Signed)
[facility] EMERGENCY DEPARTMENT Provider Note   CSN: [id number] Arrival date & time: 10/27/17  1334     History   Chief Complaint Chief Complaint  Patient presents with  . Dysuria    HPI Barnet Glasgowasia M Carlton is a 32 y.o. female who is previously healthy who presents with a 2-day history of dysuria and malodorous urine.  Patient is also had some mild frequency.  She denies any abdominal pain, nausea, vomiting, fever.  Patient has had some midline low back pain, however she states she has been working out and lifting weights recently.  She denies any numbness or tingling, saddle anesthesia, loss of bowel or bladder control.  She has not tried anything over-the-counter for symptoms.  She feels her symptoms are typical of her last UTI.  She notes that the last antibiotic she had caused her to have a yeast infection.  Patient denies any abnormal vaginal bleeding or discharge.  She also denies any concern for STD exposure.  HPI  History reviewed. No pertinent past medical history.  Patient Active Problem List   Diagnosis Date Noted  . DYSURIA 10/19/2007    Past Surgical History:  Procedure Laterality Date  . CESAREAN SECTION    . TUBAL LIGATION       OB History   None      Home Medications    Prior to Admission medications   Medication Sig Start Date End Date Taking? Authorizing Provider  cephALEXin (KEFLEX) 500 MG capsule Take 1 capsule (500 mg total) by mouth 2 (two) times daily. 10/27/17   Brayan Votaw, Waylan BogaAlexandra M, PA-C  fluconazole (DIFLUCAN) 150 MG tablet If you develop symptoms of a yeast infection, take one tablet. If your symptoms are not resolved in 72 hours, take the second tablet. 10/27/17   Emi HolesLaw, Mackenzie Lia M, PA-C    Family History History reviewed. No pertinent family history.  Social History Social History   Tobacco Use  . Smoking status: Never Smoker  . Smokeless tobacco: Never Used  Substance Use Topics  . Alcohol use: Yes    Comment: occ  . Drug use:  No     Allergies   Patient has no known allergies.   Review of Systems Review of Systems  Constitutional: Negative for fever.  Gastrointestinal: Negative for abdominal pain, nausea and vomiting.  Genitourinary: Positive for dysuria and frequency. Negative for vaginal bleeding and vaginal discharge.  Musculoskeletal: Positive for back pain.     Physical Exam Updated Vital Signs BP (!) 139/97 (BP Location: Left Arm)   Pulse 63   Temp 98.3 F (36.8 C) (Oral)   Resp 16   Ht 5\' 3"  (1.6 m)   Wt 74.8 kg   LMP 10/18/2017   SpO2 100%   BMI 29.23 kg/m   Physical Exam  Constitutional: She appears well-developed and well-nourished. No distress.  HENT:  Head: Normocephalic and atraumatic.  Mouth/Throat: Oropharynx is clear and moist. No oropharyngeal exudate.  Eyes: Pupils are equal, round, and reactive to light. Conjunctivae are normal. Right eye exhibits no discharge. Left eye exhibits no discharge. No scleral icterus.  Neck: Normal range of motion. Neck supple. No thyromegaly present.  Cardiovascular: Normal rate, regular rhythm, normal heart sounds and intact distal pulses. Exam reveals no gallop and no friction rub.  No murmur heard. Pulmonary/Chest: Effort normal and breath sounds normal. No stridor. No respiratory distress. She has no wheezes. She has no rales.  Abdominal: Soft. Bowel sounds are normal. She exhibits no distension. There is  no tenderness. There is no rebound, no guarding and no CVA tenderness.  Musculoskeletal: She exhibits no edema.  Mild midline lumbar tenderness, no tenderness bilaterally  Lymphadenopathy:    She has no cervical adenopathy.  Neurological: She is alert. Coordination normal.  Skin: Skin is warm and dry. No rash noted. She is not diaphoretic. No pallor.  Psychiatric: She has a normal mood and affect.  Nursing note and vitals reviewed.    ED Treatments / Results  Labs (all labs ordered are listed, but only abnormal results are  displayed) Labs Reviewed  URINALYSIS, ROUTINE W REFLEX MICROSCOPIC - Abnormal; Notable for the following components:      Result Value   Specific Gravity, Urine <1.005 (*)    Nitrite POSITIVE (*)    Leukocytes, UA SMALL (*)    All other components within normal limits  URINALYSIS, MICROSCOPIC (REFLEX) - Abnormal; Notable for the following components:   Bacteria, UA MANY (*)    All other components within normal limits  URINE CULTURE  PREGNANCY, URINE    EKG None  Radiology No results found.  Procedures Procedures (including critical care time)  Medications Ordered in ED Medications - No data to display   Initial Impression / Assessment and Plan / ED Course  I have reviewed the triage vital signs and the nursing notes.  Pertinent labs & imaging results that were available during my care of the patient were reviewed by me and considered in my medical decision making (see chart for details).     Pt diagnosed with a UTI and is very well-appearing. Pt is afebrile, tachycardia, hypotension, or other signs of serious infection.  Pt to be dc home with Keflex and instructions to follow up with PCP if symptoms persist.  Urine culture sent.  We will also provide Diflucan and patient is advised to take only if symptoms develop.  Suspect back pain related to patient's lifting lately, as it is midline and very mildly tender.  No CVA tenderness to concern for pyelonephritis.  Discussed strict return precautions.  Patient understands and agrees with plan.  Patient vitals stable throughout ED course and discharged in satisfactory condition.  Final Clinical Impressions(s) / ED Diagnoses   Final diagnoses:  Lower urinary tract infectious disease    ED Discharge Orders         Ordered    cephALEXin (KEFLEX) 500 MG capsule  2 times daily     10/27/17 1434    fluconazole (DIFLUCAN) 150 MG tablet     10/27/17 [address] PA-C 10/27/17 1439    Tegeler,  Canary Brim, MD 10/27/17 [phone]

## 2017-10-29 LAB — URINE CULTURE: Culture: >=100000 — AB

## 2017-10-30 ENCOUNTER — Telehealth: Payer: Self-pay | Admitting: *Deleted

## 2017-10-30 NOTE — Telephone Encounter (Signed)
Post ED Visit - Positive Culture Follow-up: Successful Patient Follow-Up  Culture assessed and recommendations reviewed by:  []  Enzo BiNathan Batchelder, Pharm.D. []  Celedonio MiyamotoJeremy Frens, Pharm.D., BCPS AQ-ID []  Garvin FilaMike Maccia, Pharm.D., BCPS []  Georgina PillionElizabeth Martin, Pharm.D., BCPS []  LakeportMinh Pham, [address]Pharm.D., BCPS, AAHIVP []  Estella HuskMichelle Turner, Pharm.D., BCPS, AAHIVP []  Lysle Pearlachel Rumbarger, PharmD, BCPS []  Phillips Climeshuy Dang, PharmD, BCPS []  Agapito GamesAlison Masters, PharmD, BCPS []  Verlan FriendsErin Deja, PharmD  Positive urine culture  []  Patient discharged without antimicrobial prescription and treatment is now indicated [x]  Organism is resistant to prescribed ED discharge antimicrobial []  Patient with positive blood cultures  Changes discussed with ED provider:Dr. Patria Maneampos New antibiotic prescription  Bactrim DS 1 PO BID x 3 days [facility] [address] [phone]  Contacted patient, date 10/30/17, time 1000   Lysle PearlRobertson, [name] 10/30/2017, 10:03 AM

## 2017-10-30 NOTE — Progress Notes (Signed)
ED Antimicrobial Stewardship Positive Culture Follow Up   Debra Rosario is an 32 y.o. female who [facility] with a chief complaint of  Chief Complaint  Patient presents with  . Dysuria  -Presenting with dysuria x2 days, maldorous urine, increased frequency, and lower back pain.  Recent Results (from the past 720 hour(s))  Urine culture     Status: Abnormal   Collection Time: 10/27/17  1:48 PM  Result Value Ref Range Status   Specimen Description   Final    URINE, RANDOM Performed at [facility], [address] [place], [place] [address]    Special Requests   Final    NONE Performed at [facility], [address] [place], [place] [address]    Culture >=100,000 COLONIES/mL ESCHERICHIA COLI (A)  Final   Report Status 10/29/2017 FINAL  Final   Organism ID, Bacteria ESCHERICHIA COLI (A)  Final      Susceptibility   Escherichia coli - MIC*    AMPICILLIN >=32 RESISTANT Resistant     CEFAZOLIN 32 INTERMEDIATE Intermediate     CEFTRIAXONE <=1 SENSITIVE Sensitive     CIPROFLOXACIN <=0.25 SENSITIVE Sensitive     GENTAMICIN <=1 SENSITIVE Sensitive     IMIPENEM <=0.25 SENSITIVE Sensitive     NITROFURANTOIN <=16 SENSITIVE Sensitive     TRIMETH/SULFA <=20 SENSITIVE Sensitive     AMPICILLIN/SULBACTAM >=32 RESISTANT Resistant     PIP/TAZO <=4 SENSITIVE Sensitive     Extended ESBL NEGATIVE Sensitive     * >=100,000 COLONIES/mL ESCHERICHIA COLI    [x]  Treated with cephalexin, organism resistant to prescribed antimicrobial []  Patient discharged originally without antimicrobial agent and treatment is now indicated  New antibiotic prescription: Bactrim DS - 1 tablet twice daily for 3 days  ED Provider: Dr Pryor Ochoaampos   Altamese Deguire A Perkins 10/30/2017, 2:21 PM Clinical Pharmacist Monday - Friday phone -  [phone] Saturday - Sunday phone - [phone]

## 2019-09-26 ENCOUNTER — Other Ambulatory Visit: Payer: Self-pay

## 2019-09-26 ENCOUNTER — Encounter (HOSPITAL_COMMUNITY): Payer: Self-pay

## 2019-09-26 ENCOUNTER — Ambulatory Visit (HOSPITAL_COMMUNITY)
Admission: EM | Admit: 2019-09-26 | Discharge: 2019-09-26 | Disposition: A | Discharge status: Home / Self Care | Payer: Medicaid Other | Attending: Family Medicine | Admitting: Family Medicine

## 2019-09-26 DIAGNOSIS — R300 Dysuria: Secondary | ICD-10-CM | POA: Diagnosis not present

## 2019-09-26 DIAGNOSIS — N390 Urinary tract infection, site not specified: Secondary | ICD-10-CM | POA: Insufficient documentation

## 2019-09-26 LAB — POCT URINALYSIS DIPSTICK, ED / UC
Bilirubin Urine: NEGATIVE
Glucose, UA: NEGATIVE mg/dL
Hgb urine dipstick: NEGATIVE
Ketones, ur: NEGATIVE mg/dL
Nitrite: POSITIVE — AB
Protein, ur: NEGATIVE mg/dL
Specific Gravity, Urine: 1.025 (ref 1.005–1.030)
Urobilinogen, UA: 0.2 mg/dL (ref 0.0–1.0)
pH: 7 (ref 5.0–8.0)

## 2019-09-26 MED ORDER — NITROFURANTOIN MONOHYD MACRO 100 MG PO CAPS: 100.0000 mg | ORAL_CAPSULE | Freq: Two times a day (BID) | ORAL | 0 refills | Status: DC

## 2019-09-26 NOTE — Discharge Instructions (Addendum)
Treating you for urinary tract infection.  Take medication as prescribed. Follow up as needed for continued or worsening symptoms

## 2019-09-26 NOTE — ED Triage Notes (Signed)
Pt c/o dysuria and foul smelling urine . Denies concern for STD.

## 2019-09-26 NOTE — ED Provider Notes (Signed)
[facility]    CSN: [id number] Arrival date & time: 09/26/19  1025      History   Chief Complaint Chief Complaint  Patient presents with  . Dysuria    HPI Debra Rosario is a 34 y.o. female.   Pt is a 34 year old female who presents today with dysuria and foul-smelling urine.  Symptoms been constant for the past few days.  Patient with history of recurrent urinary tract infections.  Denies any vaginal symptoms or any concern for STDs.  Denies any abdominal pain, flank pain, fevers, nausea or vomiting.     History reviewed. No pertinent past medical history.  Patient Active Problem List   Diagnosis Date Noted  . DYSURIA 10/19/2007    Past Surgical History:  Procedure Laterality Date  . CESAREAN SECTION    . TUBAL LIGATION      OB History   No obstetric history on file.      Home Medications    Prior to Admission medications   Medication Sig Start Date End Date Taking? Authorizing Provider  nitrofurantoin, macrocrystal-monohydrate, (MACROBID) 100 MG capsule Take 1 capsule (100 mg total) by mouth 2 (two) times daily. 09/26/19   Janace Aris, NP    Family History Family History  Family history unknown: Yes    Social History Social History   Tobacco Use  . Smoking status: Never Smoker  . Smokeless tobacco: Never Used  Vaping Use  . Vaping Use: Never used  Substance Use Topics  . Alcohol use: Yes    Comment: occ  . Drug use: No     Allergies   Patient has no known allergies.   Review of Systems Review of Systems   Physical Exam Triage Vital Signs ED Triage Vitals [09/26/19 1214]  Enc Vitals Group     BP 127/73     Pulse Rate 69     Resp 16     Temp 97.8 F (36.6 C)     Temp src      SpO2 100 %     Weight      Height      Head Circumference      Peak Flow      Pain Score 4     Pain Loc      Pain Edu?      Excl. in GC?    No data found.  Updated Vital Signs BP 127/73   Pulse 69   Temp 97.8 F (36.6 C)   Resp  16   LMP 09/04/2019   SpO2 100%   Visual Acuity Right Eye Distance:   Left Eye Distance:   Bilateral Distance:    Right Eye Near:   Left Eye Near:    Bilateral Near:     Physical Exam Vitals and nursing note reviewed.  Constitutional:      General: She is not in acute distress.    Appearance: Normal appearance. She is not ill-appearing, toxic-appearing or diaphoretic.  HENT:     Head: Normocephalic.     Nose: Nose normal.  Eyes:     Conjunctiva/sclera: Conjunctivae normal.  Pulmonary:     Effort: Pulmonary effort is normal.  Musculoskeletal:        General: Normal range of motion.     Cervical back: Normal range of motion.  Skin:    General: Skin is warm and dry.     Findings: No rash.  Neurological:     Mental Status: She is  alert.  Psychiatric:        Mood and Affect: Mood normal.      UC Treatments / Results  Labs (all labs ordered are listed, but only abnormal results are displayed) Labs Reviewed  POCT URINALYSIS DIPSTICK, ED / UC - Abnormal; Notable for the following components:      Result Value   Nitrite POSITIVE (*)    Leukocytes,Ua SMALL (*)    All other components within normal limits  URINE CULTURE    EKG   Radiology No results found.  Procedures Procedures (including critical care time)  Medications Ordered in UC Medications - No data to display  Initial Impression / Assessment and Plan / UC Course  I have reviewed the triage vital signs and the nursing notes.  Pertinent labs & imaging results that were available during my care of the patient were reviewed by me and considered in my medical decision making (see chart for details).     UTI urine with small leuks and positive nitrates.  Sending for culture. Treating with Macrobid Push fluids Follow up as needed for continued or worsening symptoms  Final Clinical Impressions(s) / UC Diagnoses   Final diagnoses:  Lower urinary tract infectious disease     Discharge Instructions      Treating you for urinary tract infection.  Take medication as prescribed. Follow up as needed for continued or worsening symptoms     ED Prescriptions    Medication Sig Dispense Auth. Provider   nitrofurantoin, macrocrystal-monohydrate, (MACROBID) 100 MG capsule Take 1 capsule (100 mg total) by mouth 2 (two) times daily. 10 capsule Dahlia Byes A, NP     PDMP not reviewed this encounter.   Janace Aris, NP 09/26/19 1249

## 2019-09-28 LAB — URINE CULTURE: Culture: >=100000 — AB

## 2020-03-29 ENCOUNTER — Encounter (HOSPITAL_COMMUNITY): Payer: Self-pay

## 2020-03-29 ENCOUNTER — Ambulatory Visit (HOSPITAL_COMMUNITY)
Admission: EM | Admit: 2020-03-29 | Discharge: 2020-03-29 | Disposition: A | Discharge status: Home / Self Care | Payer: Medicaid Other | Attending: Family Medicine | Admitting: Family Medicine

## 2020-03-29 DIAGNOSIS — R300 Dysuria: Secondary | ICD-10-CM | POA: Diagnosis present

## 2020-03-29 LAB — POCT URINALYSIS DIPSTICK, ED / UC
Bilirubin Urine: NEGATIVE
Glucose, UA: NEGATIVE mg/dL
Ketones, ur: NEGATIVE mg/dL
Nitrite: NEGATIVE
Protein, ur: NEGATIVE mg/dL
Specific Gravity, Urine: 1.01 (ref 1.005–1.030)
Urobilinogen, UA: 0.2 mg/dL (ref 0.0–1.0)
pH: 6 (ref 5.0–8.0)

## 2020-03-29 LAB — POC URINE PREG, ED: Preg Test, Ur: NEGATIVE

## 2020-03-29 NOTE — Discharge Instructions (Addendum)
Lab results pending 2-3 days, if Mychart activated results with populate. You will receive call if any results are positive.   Increase water intake, recommended intake is 8 glasses of water  Follow up at Urgent Care if pain worsens, odor persist or new symptoms appear

## 2020-03-29 NOTE — ED Triage Notes (Signed)
Pt c/o abdominal cramping and foul odor in urine X 3 days. Pt denies frequency. She states when she does not drink enough water, she feels a burning sensation in urine.

## 2020-03-29 NOTE — ED Provider Notes (Signed)
[facility]    CSN: [id number] Arrival date & time: 03/29/20  0820      History   Chief Complaint Chief Complaint  Patient presents with  . Abdominal Cramping  . Foul Odor in Urine    HPI Debra Rosario is a 35 y.o. female.   Patient presents with foul odor in urine beginning 3 days ago with burning with urination. Denies itching, flank pain, abdominal cramps, discharge, lesions, diarrhea, constipation, changes in diet, fever. Denies possibility of pregnancy. History of tubal ligation. LMP 2/7. Improvement of symptoms with increased water intake.   History reviewed. No pertinent past medical history.  Patient Active Problem List   Diagnosis Date Noted  . DYSURIA 10/19/2007    Past Surgical History:  Procedure Laterality Date  . CESAREAN SECTION    . TUBAL LIGATION      OB History   No obstetric history on file.      Home Medications    Prior to Admission medications   Medication Sig Start Date End Date Taking? Authorizing Provider  nitrofurantoin, macrocrystal-monohydrate, (MACROBID) 100 MG capsule Take 1 capsule (100 mg total) by mouth 2 (two) times daily. 09/26/19   Janace Aris, NP    Family History Family History  Family history unknown: Yes    Social History Social History   Tobacco Use  . Smoking status: Never Smoker  . Smokeless tobacco: Never Used  Vaping Use  . Vaping Use: Never used  Substance Use Topics  . Alcohol use: Yes    Comment: occ  . Drug use: No     Allergies   Patient has no known allergies.   Review of Systems Review of Systems  Constitutional: Negative.   HENT: Negative.   Respiratory: Negative.   Cardiovascular: Negative.   Gastrointestinal: Negative.   Genitourinary: Positive for dysuria. Negative for decreased urine volume, difficulty urinating, dyspareunia, enuresis, flank pain, frequency, genital sores, hematuria, menstrual problem, pelvic pain, urgency, vaginal bleeding, vaginal discharge and  vaginal pain.  Neurological: Negative.   Psychiatric/Behavioral: Negative.      Physical Exam Triage Vital Signs ED Triage Vitals  Enc Vitals Group     BP 03/29/20 0836 103/62     Pulse Rate 03/29/20 0836 80     Resp 03/29/20 0836 17     Temp 03/29/20 0836 98.2 F (36.8 C)     Temp Source 03/29/20 0836 Oral     SpO2 03/29/20 0836 99 %     Weight --      Height --      Head Circumference --      Peak Flow --      Pain Score 03/29/20 0835 0     Pain Loc --      Pain Edu? --      Excl. in GC? --    No data found.  Updated Vital Signs BP 103/62 (BP Location: Right Arm)   Pulse 80   Temp 98.2 F (36.8 C) (Oral)   Resp 17   LMP 03/19/2020 (Approximate)   SpO2 99%   Visual Acuity Right Eye Distance:   Left Eye Distance:   Bilateral Distance:    Right Eye Near:   Left Eye Near:    Bilateral Near:     Physical Exam Constitutional:      Appearance: Normal appearance. She is normal weight.  HENT:     Head: Normocephalic.  Eyes:     Extraocular Movements: Extraocular movements intact.  Pulmonary:  Effort: Pulmonary effort is normal.  Abdominal:     General: Abdomen is flat. Bowel sounds are normal. There is no distension.     Palpations: Abdomen is soft. There is no mass.     Tenderness: There is no abdominal tenderness. There is no right CVA tenderness or left CVA tenderness.  Musculoskeletal:        General: Normal range of motion.     Cervical back: Normal range of motion.  Skin:    General: Skin is warm and dry.  Neurological:     General: No focal deficit present.     Mental Status: She is alert and oriented to person, place, and time. Mental status is at baseline.  Psychiatric:        Mood and Affect: Mood normal.        Behavior: Behavior normal.        Thought Content: Thought content normal.        Judgment: Judgment normal.      UC Treatments / Results  Labs (all labs ordered are listed, but only abnormal results are displayed) Labs  Reviewed  POCT URINALYSIS DIPSTICK, ED / UC - Abnormal; Notable for the following components:      Result Value   Hgb urine dipstick TRACE (*)    Leukocytes,Ua SMALL (*)    All other components within normal limits    EKG   Radiology No results found.  Procedures Procedures (including critical care time)  Medications Ordered in UC Medications - No data to display  Initial Impression / Assessment and Plan / UC Course  I have reviewed the triage vital signs and the nursing notes.  Pertinent labs & imaging results that were available during my care of the patient were reviewed by me and considered in my medical decision making (see chart for details).  Dysuria  1. Urinalysis- negative, sent to culture due to symptoms consistent with past UTI 2.  Screening for STI- results pending, abstinence encouraged until lab results  3. Urine pregnancy- negative 4. Increase water intake, recommended 8 glasses of water per day  Final Clinical Impressions(s) / UC Diagnoses   Final diagnoses:  None   Discharge Instructions   None    ED Prescriptions    None     PDMP not reviewed this encounter.   Valinda Hoar, [place] 03/29/20 [phone]

## 2020-03-30 LAB — CERVICOVAGINAL ANCILLARY ONLY
Bacterial Vaginitis (gardnerella): NEGATIVE
Candida Glabrata: NEGATIVE
Candida Vaginitis: NEGATIVE
Chlamydia: NEGATIVE
Comment: NEGATIVE
Comment: NEGATIVE
Comment: NEGATIVE
Comment: NEGATIVE
Comment: NEGATIVE
Comment: NORMAL
Neisseria Gonorrhea: NEGATIVE
Trichomonas: NEGATIVE

## 2020-03-31 LAB — URINE CULTURE: Culture: 80000 — AB

## 2020-04-02 ENCOUNTER — Telehealth (HOSPITAL_COMMUNITY): Payer: Self-pay | Admitting: Emergency Medicine

## 2020-04-02 MED ORDER — NITROFURANTOIN MONOHYD MACRO 100 MG PO CAPS: 100.0000 mg | ORAL_CAPSULE | Freq: Two times a day (BID) | ORAL | 0 refills | Status: DC

## 2020-04-02 MED ORDER — SULFAMETHOXAZOLE-TRIMETHOPRIM 800-160 MG PO TABS: 1.0000 | ORAL_TABLET | Freq: Two times a day (BID) | ORAL | 0 refills | Status: AC

## 2020-07-21 ENCOUNTER — Encounter (HOSPITAL_COMMUNITY): Payer: Self-pay | Admitting: Emergency Medicine

## 2020-07-21 ENCOUNTER — Ambulatory Visit (HOSPITAL_COMMUNITY)
Admission: EM | Admit: 2020-07-21 | Discharge: 2020-07-21 | Disposition: A | Discharge status: Home / Self Care | Payer: Medicaid Other | Attending: Internal Medicine | Admitting: Internal Medicine

## 2020-07-21 ENCOUNTER — Other Ambulatory Visit: Payer: Self-pay

## 2020-07-21 DIAGNOSIS — Z87440 Personal history of urinary (tract) infections: Secondary | ICD-10-CM | POA: Diagnosis present

## 2020-07-21 DIAGNOSIS — R8290 Unspecified abnormal findings in urine: Secondary | ICD-10-CM | POA: Insufficient documentation

## 2020-07-21 LAB — POCT URINALYSIS DIPSTICK, ED / UC
Bilirubin Urine: NEGATIVE
Glucose, UA: NEGATIVE mg/dL
Hgb urine dipstick: NEGATIVE
Ketones, ur: NEGATIVE mg/dL
Leukocytes,Ua: NEGATIVE
Nitrite: NEGATIVE
Protein, ur: NEGATIVE mg/dL
Specific Gravity, Urine: 1.02 (ref 1.005–1.030)
Urobilinogen, UA: 0.2 mg/dL (ref 0.0–1.0)
pH: 8.5 — ABNORMAL HIGH (ref 5.0–8.0)

## 2020-07-21 NOTE — Discharge Instructions (Addendum)

## 2020-07-21 NOTE — ED Triage Notes (Signed)
Pt presents with urine odor xs 1 week. Denies any other symptoms.

## 2020-07-21 NOTE — ED Provider Notes (Signed)
[name]   MRN: [id number] DOB: December 09, 1985  Subjective:   Debra Rosario is a 35 y.o. female presenting for 1 week history of persistent and recurrent malodorous urine. Denies fever, n/v, abdominal pain, pelvic pain, rashes, dysuria, urinary frequency, hematuria, vaginal discharge.  Patient states that the symptoms have led to urinary tract infections in the past.  She is not concerned about an STI and does not want to be tested at all.  No current facility-administered medications for this encounter.  Current Outpatient Medications:    nitrofurantoin, macrocrystal-monohydrate, (MACROBID) 100 MG capsule, Take 1 capsule (100 mg total) by mouth 2 (two) times daily., Disp: 10 capsule, Rfl: 0   No Known Allergies  History reviewed. No pertinent past medical history.   Past Surgical History:  Procedure Laterality Date   CESAREAN SECTION     TUBAL LIGATION      Family History  Family history unknown: Yes    Social History   Tobacco Use   Smoking status: Never   Smokeless tobacco: Never  Vaping Use   Vaping Use: Never used  Substance Use Topics   Alcohol use: Yes    Comment: occ   Drug use: No    ROS   Objective:   Vitals: BP 112/71 (BP Location: Right Arm)   Pulse 80   Temp 98.6 F (37 C) (Oral)   Resp 16   LMP 07/16/2020   SpO2 100%   Physical Exam Constitutional:      General: She is not in acute distress.    Appearance: Normal appearance. She is well-developed. She is not ill-appearing, toxic-appearing or diaphoretic.  HENT:     Head: Normocephalic and atraumatic.     Nose: Nose normal.     Mouth/Throat:     Mouth: Mucous membranes are moist.     Pharynx: Oropharynx is clear.  Eyes:     General: No scleral icterus.       Right eye: No discharge.        Left eye: No discharge.     Extraocular Movements: Extraocular movements intact.     Conjunctiva/sclera: Conjunctivae normal.     Pupils: Pupils are equal, round, and reactive to  light.  Cardiovascular:     Rate and Rhythm: Normal rate.  Pulmonary:     Effort: Pulmonary effort is normal.  Abdominal:     General: Bowel sounds are normal. There is no distension.     Palpations: Abdomen is soft. There is no mass.     Tenderness: There is no abdominal tenderness. There is no right CVA tenderness, left CVA tenderness, guarding or rebound.  Skin:    General: Skin is warm and dry.  Neurological:     General: No focal deficit present.     Mental Status: She is alert and oriented to person, place, and time.  Psychiatric:        Mood and Affect: Mood normal.        Behavior: Behavior normal.        Thought Content: Thought content normal.        Judgment: Judgment normal.    Results for orders placed or performed during the hospital encounter of 07/21/20 (from the past 24 hour(s))  POCT Urinalysis Dipstick (ED/UC)     Status: Abnormal   Collection Time: 07/21/20 11:27 AM  Result Value Ref Range   Glucose, UA NEGATIVE NEGATIVE mg/dL   Bilirubin Urine NEGATIVE NEGATIVE   Ketones, ur NEGATIVE NEGATIVE  mg/dL   Specific Gravity, Urine 1.020 1.005 - 1.030   Hgb urine dipstick NEGATIVE NEGATIVE   pH 8.5 (H) 5.0 - 8.0   Protein, ur NEGATIVE NEGATIVE mg/dL   Urobilinogen, UA 0.2 0.0 - 1.0 mg/dL   Nitrite NEGATIVE NEGATIVE   Leukocytes,Ua NEGATIVE NEGATIVE    Assessment and Plan :   PDMP not reviewed this encounter.  1. Malodorous urine   2. History of UTI     Patient has had resistant E. coli infection in the past.  However given that she is largely asymptomatic, discussed antibiotic stewardship and will prescribe antibiotics based off of the urine culture.  Patient was agreeable.  Maintain daily adequate hydration. Counseled patient on potential for adverse effects with medications prescribed/recommended today, ER and return-to-clinic precautions discussed, patient verbalized understanding.    Wallis Bamberg, PA-C 07/21/20 1230

## 2020-07-24 LAB — URINE CULTURE: Culture: >=100000 — AB

## 2020-07-26 ENCOUNTER — Telehealth (HOSPITAL_COMMUNITY): Payer: Self-pay | Admitting: Emergency Medicine

## 2020-07-26 MED ORDER — SULFAMETHOXAZOLE-TRIMETHOPRIM 800-160 MG PO TABS: 1.0000 | ORAL_TABLET | Freq: Two times a day (BID) | ORAL | 0 refills | Status: AC

## 2020-11-17 ENCOUNTER — Other Ambulatory Visit: Payer: Self-pay

## 2020-11-17 ENCOUNTER — Emergency Department (HOSPITAL_BASED_OUTPATIENT_CLINIC_OR_DEPARTMENT_OTHER)
Admission: EM | Admit: 2020-11-17 | Discharge: 2020-11-17 | Disposition: A | Discharge status: Home / Self Care | Payer: Medicaid Other | Attending: Emergency Medicine | Admitting: Emergency Medicine

## 2020-11-17 ENCOUNTER — Encounter (HOSPITAL_BASED_OUTPATIENT_CLINIC_OR_DEPARTMENT_OTHER): Payer: Self-pay | Admitting: Emergency Medicine

## 2020-11-17 DIAGNOSIS — N6342 Unspecified lump in left breast, subareolar: Secondary | ICD-10-CM | POA: Diagnosis present

## 2020-11-17 NOTE — ED Triage Notes (Signed)
Pt felt a mass on her left breast 2 days ago. Pt states it does not hurt.

## 2020-11-18 NOTE — ED Provider Notes (Signed)
[facility] EMERGENCY DEPT Provider Note   CSN: [id number] Arrival date & time: 11/17/20  1137     History Chief Complaint  Patient presents with   Breast Mass    Debra Rosario is a 35 y.o. female.  HPI   34 year old female with no significant medical history presents with concern for left breast mass.  She noticed an area under her left areola yesterday.  It is not painful.  She denies any discharge from the nipple, fevers.  No trauma to the area.  Reports she has a tubal ligation and has a low suspicion for pregnancy.  She is going to see family medicine with Debra Rosario on October 26 for her first visit.  Family history with colon cancer, no immediate family members with breast cancer    History reviewed. No pertinent past medical history.  Patient Active Problem List   Diagnosis Date Noted   DYSURIA 10/19/2007    Past Surgical History:  Procedure Laterality Date   CESAREAN SECTION     TUBAL LIGATION       OB History   No obstetric history on file.     Family History  Family history unknown: Yes    Social History   Tobacco Use   Smoking status: Never   Smokeless tobacco: Never  Vaping Use   Vaping Use: Never used  Substance Use Topics   Alcohol use: Yes    Comment: occ   Drug use: Yes    Frequency: 7.0 times per week    Types: Marijuana    Home Medications Prior to Admission medications   Medication Sig Start Date End Date Taking? Authorizing Provider  nitrofurantoin, macrocrystal-monohydrate, (MACROBID) 100 MG capsule Take 1 capsule (100 mg total) by mouth 2 (two) times daily. 04/02/20   Lamptey, Britta Mccreedy, MD    Allergies    Patient has no known allergies.  Review of Systems   Review of Systems  Constitutional:  Negative for fever.  Respiratory:  Negative for cough and shortness of breath.   Cardiovascular:  Negative for chest pain.  Skin:  Negative for rash and wound.   Physical Exam Updated Vital Signs BP 135/88   Pulse  70   Temp 98.1 F (36.7 C) (Oral)   Resp 20   LMP 11/11/2020 (Exact Date)   SpO2 100%   Physical Exam Vitals and nursing note reviewed.  Constitutional:      General: She is not in acute distress.    Appearance: Normal appearance. She is not ill-appearing, toxic-appearing or diaphoretic.  HENT:     Head: Normocephalic.  Eyes:     Conjunctiva/sclera: Conjunctivae normal.  Cardiovascular:     Rate and Rhythm: Normal rate and regular rhythm.     Pulses: Normal pulses.  Pulmonary:     Effort: Pulmonary effort is normal. No respiratory distress.  Chest:     Chest wall: Tenderness: no tenderness, 1cm mobile mass 5oclock subareolar.  Musculoskeletal:        General: No deformity or signs of injury.     Cervical back: No rigidity.  Skin:    General: Skin is warm and dry.     Coloration: Skin is not jaundiced or pale.  Neurological:     General: No focal deficit present.     Mental Status: She is alert and oriented to person, place, and time.    ED Results / Procedures / Treatments   Labs (all labs ordered are listed, but only abnormal results are  displayed) Labs Reviewed - No data to display  EKG None  Radiology No results found.  Procedures Procedures   Medications Ordered in ED Medications - No data to display  ED Course  I have reviewed the triage vital signs and the nursing notes.  Pertinent labs & imaging results that were available during my care of the patient were reviewed by me and considered in my medical decision making (see chart for details).    MDM Rules/Calculators/A&P                            35 year old female with no significant medical history presents with concern for left breast mass.  Exam is not consistent with abscess or cellulitis.  Discussed that we do not have the emergency department capabilities for appropriate imaging that she requires, and that she will need to go to the breast center.  Contacted the family medicine team on-call  and provided patient with the phone number for the imaging center and recommend discussing with her family medicine provider. Offered pregnancy test but also discussed this may be done as an outpatient.    Final Clinical Impression(s) / ED Diagnoses Final diagnoses:  Subareolar mass of left breast    Rx / DC Orders ED Discharge Orders     None        Debra Monday, MD 11/18/20 [phone]

## 2020-11-19 ENCOUNTER — Telehealth: Payer: Self-pay

## 2020-11-19 NOTE — Telephone Encounter (Signed)
Patient calls nurse line requesting referral for breast center from new PCP. Patient was [facility] for breast lump. Patient is establishing care on 11/29/20. Advised that patient should speak with Dr. Melissa Noon at new patient appointment regarding this concern. Patient states that she cannot wait until then for concern.   Patient attempted to call the Breast Center to schedule appointment, however, they need referral from PCP office. Patient would like to go ahead and start the process for referral.   Please advise.   Veronda Prude, RN

## 2020-11-21 NOTE — Telephone Encounter (Signed)
Called patient and informed of below.  ? ?Amandalee Lacap C Quisha Mabie, RN ? ?

## 2020-11-29 ENCOUNTER — Other Ambulatory Visit: Payer: Self-pay

## 2020-11-29 ENCOUNTER — Encounter: Payer: Self-pay | Admitting: Student

## 2020-11-29 ENCOUNTER — Ambulatory Visit: Payer: Medicaid Other | Admitting: Student

## 2020-11-29 DIAGNOSIS — N6325 Unspecified lump in the left breast, overlapping quadrants: Secondary | ICD-10-CM | POA: Diagnosis present

## 2020-11-29 NOTE — Assessment & Plan Note (Addendum)
Breast lump with benign findings of mobile, well-circumcised. No constitutional symptoms concerning for systemic malignancy including weight loss, fever, or chills.  This is likely a fibroadenoma or other benign cystic growth given clinical characteristics, but will rule out potential malignancy with imaging. Less likely to be a breast abscess as she lacks tenderness, fluctuance, erythema, warmth, fever or chills.  - [facility] to [facility] for potential malignant concern/features - Will follow up with patient on these results - Provided patient with education/information on importance of performing self-breast exams

## 2020-11-29 NOTE — Patient Instructions (Signed)
It was great to see you! Thank you for allowing me to participate in your care!   Our plans for today:  - I placed an order for a Diagnostic Mammogram of the left breast at [facility]. - I will follow-up on this imaging and call you with the results  If you develop fever >100.4 F,  chills, or pain in your breast please seek urgent care. Please call me if you have any further concerns  Best wishes,  Dr. Darral Dash, DO [facility] [phone]

## 2020-11-29 NOTE — Progress Notes (Addendum)
    SUBJECTIVE:   CHIEF COMPLAINT / HPI:   Concern for left breast lump Patient was seen on 11/17/20 at [facility] ED for subareolar left breast lump she felt at home while examining her breast on 10/7 after she felt a "pinch" pain in her breast. It is non-tender. She has not noticed any nipple discharge, nipple bleeding, nipple retractions, dimpling or crackling of the skin, rashes, lumps on the right breast. She has had left nipple tenderness. She has lost 25lbs intentionally with exercise in the past few months. No fatigue, night sweats, chills. No fevers. Has not noticed any changes in size of breasts or asymmetry. LMP- 11/07/2020-10/15/2020, no change noticed with menstrual cycle. There is a family history of colon cancer, which her maternal grandmother passed away from last month. No family history of breast, ovarian, endometrial cancer. No known history of BRCA1 or BRCA2 mutation in family.  PERTINENT  PMH / PSH: Tubal ligation, hx UTI  OBJECTIVE:   BP 139/73   Pulse 72   Ht $R'5\' 3"'nf$  (1.6 m)   Wt 175 lb 12.8 oz (79.7 kg)   LMP 11/07/2020 (Exact Date)   SpO2 100%   BMI 31.14 kg/m   General: Well-appearing female in no distress Breast: Chaperone present for exam. Visual inspection with patient seated reveals symmetry in shape of right and left breasts; pendulous with no nipple cracking, discharge, or skin dimpling. Two marble-sized, well-circumcised mobile, rubbery lumps palpated at 6 o'clock subareolar position of left breast that is non-tender. No axillary lymphadenopathy.  CV: Regular rate Resp: Normal respirations. No increased work of breathing. Skin: Warm, dry, well-perfused   ASSESSMENT/PLAN:   Breast lump on left side at 6 o'clock position Breast lump with benign findings of mobile, well-circumcised. No constitutional symptoms concerning for systemic malignancy including weight loss, fever, or chills.  This is likely a fibroadenoma or other benign cystic growth  given clinical characteristics, but will rule out potential malignancy with imaging. Less likely to be a breast abscess as she lacks tenderness, fluctuance, erythema, warmth, fever or chills.  - Ordered diagnostic mammogram of left breast at [facility] to screen for potential malignant concern/features - Will follow up with patient on these results - Provided patient with education/information on importance of performing self-breast exams   Orvis Brill, [place]

## 2020-12-05 ENCOUNTER — Other Ambulatory Visit: Payer: Self-pay | Admitting: Student

## 2020-12-05 DIAGNOSIS — N6325 Unspecified lump in the left breast, overlapping quadrants: Secondary | ICD-10-CM

## 2020-12-28 ENCOUNTER — Other Ambulatory Visit: Payer: Self-pay

## 2020-12-28 ENCOUNTER — Ambulatory Visit (INDEPENDENT_AMBULATORY_CARE_PROVIDER_SITE_OTHER): Payer: Medicaid Other | Admitting: Student

## 2020-12-28 ENCOUNTER — Other Ambulatory Visit (HOSPITAL_COMMUNITY)
Admission: RE | Admit: 2020-12-28 | Discharge: 2020-12-28 | Disposition: A | Discharge status: Home / Self Care | Payer: Medicaid Other | Source: Ambulatory Visit | Attending: Family Medicine | Admitting: Family Medicine

## 2020-12-28 ENCOUNTER — Encounter: Payer: Self-pay | Admitting: Student

## 2020-12-28 VITALS — BP 105/81 | HR 74 | Wt 174.0 lb

## 2020-12-28 DIAGNOSIS — N920 Excessive and frequent menstruation with regular cycle: Secondary | ICD-10-CM

## 2020-12-28 DIAGNOSIS — Z124 Encounter for screening for malignant neoplasm of cervix: Secondary | ICD-10-CM | POA: Insufficient documentation

## 2020-12-28 DIAGNOSIS — N6325 Unspecified lump in the left breast, overlapping quadrants: Secondary | ICD-10-CM | POA: Diagnosis not present

## 2020-12-28 LAB — POCT URINE PREGNANCY: Preg Test, Ur: NEGATIVE

## 2020-12-28 NOTE — Progress Notes (Signed)
    SUBJECTIVE:   CHIEF COMPLAINT / HPI:   Breast lump Pt reports lump is gone. No breast tenderness or other lumps noted.  Routine screening Pt here for pap smear. Last pap - 05/16/2014, normal. LMP 12/07/2020. Noted some spotting after period that lasted longer than usual. Menses was not abnormal, not more painful than usual. Had BL tubal ligation 13 years ago. Sexually active with one partner. Took HPT that was negative. Pt agreed to testing for G/C with Pap smear.  PERTINENT  PMH / PSH: None  OBJECTIVE:   BP 105/81   Pulse 74   Wt 174 lb (78.9 kg)   LMP 12/07/2020   BMI 30.82 kg/m   General: Well-appearing female, appears stated age  Resp: Normal work of breathing on room air.  GU: Chaperone present. External vulva and vagina nonerythematous, without any obvious lesions or rash. Yellowish tinted discharge appreciated.  Normal ruggae of vaginal walls.  Cervix is non erythematous and non-friable, although scant amount of blood in posterior fornix.   Skin: Warm, dry  ASSESSMENT/PLAN:   Breast lump on left side at 6 o'clock position Problem resolved. Breast US and diagnostic mammogram normal. BI-RADS: 1, negative. No concern for malignancy, was likely a cyst or fibroadenoma. - Will discuss further screening in 4 years when patient is 35 years of age. Most expert groups, including ACS, recommends shared-decision making for breast cancer screening at that age - No data to support self-breast exam screenings at home, discussed with patient   Pap smear for cervical cancer screening Pap smear performed today. No gross abnormalities on exam. Also testing for G/C given spotting/yellow discharge. POC urine pregnancy test negative     Darral Dash, DO [facility]

## 2020-12-28 NOTE — Patient Instructions (Addendum)
It was great seeing you!  We did a Pap Smear today which screens for cervical cancer, your results should return in about 5 days. I will call you if the results are abnormal, otherwise I will send you a message. Your urine pregnancy test was negative. Continue to monitor your spotting and if it worsens, please come see me for a visit.  Your breast imaging was normal, which we also discussed. We will discuss further screening in 4 years when you turn 35 years old.  Best wishes, Dr. Melissa Noon

## 2020-12-28 NOTE — Assessment & Plan Note (Signed)
Pap smear performed today. No gross abnormalities on exam. Also testing for G/C given spotting/yellow discharge. POC urine pregnancy test negative

## 2020-12-28 NOTE — Assessment & Plan Note (Signed)
Problem resolved. Breast US and diagnostic mammogram normal. BI-RADS: 1, negative. No concern for malignancy, was likely a cyst or fibroadenoma. - Will discuss further screening in 4 years when patient is 35 years of age. Most expert groups, including ACS, recommends shared-decision making for breast cancer screening at that age - No data to support self-breast exam screenings at home, discussed with patient

## 2021-01-05 ENCOUNTER — Emergency Department (HOSPITAL_BASED_OUTPATIENT_CLINIC_OR_DEPARTMENT_OTHER)
Admission: EM | Admit: 2021-01-05 | Discharge: 2021-01-05 | Disposition: A | Discharge status: Home / Self Care | Payer: Medicaid Other | Attending: Emergency Medicine | Admitting: Emergency Medicine

## 2021-01-05 ENCOUNTER — Other Ambulatory Visit: Payer: Self-pay

## 2021-01-05 ENCOUNTER — Encounter (HOSPITAL_BASED_OUTPATIENT_CLINIC_OR_DEPARTMENT_OTHER): Payer: Self-pay | Admitting: Emergency Medicine

## 2021-01-05 DIAGNOSIS — N3000 Acute cystitis without hematuria: Secondary | ICD-10-CM | POA: Insufficient documentation

## 2021-01-05 DIAGNOSIS — R1030 Lower abdominal pain, unspecified: Secondary | ICD-10-CM | POA: Diagnosis present

## 2021-01-05 LAB — URINALYSIS, ROUTINE W REFLEX MICROSCOPIC
Bilirubin Urine: NEGATIVE
Glucose, UA: NEGATIVE mg/dL
Hgb urine dipstick: NEGATIVE
Ketones, ur: NEGATIVE mg/dL
Nitrite: POSITIVE — AB
Protein, ur: NEGATIVE mg/dL
Specific Gravity, Urine: 1.01 (ref 1.005–1.030)
pH: 5.5 (ref 5.0–8.0)

## 2021-01-05 LAB — PREGNANCY, URINE: Preg Test, Ur: NEGATIVE

## 2021-01-05 MED ORDER — SULFAMETHOXAZOLE-TRIMETHOPRIM 800-160 MG PO TABS: 1.0000 | ORAL_TABLET | Freq: Two times a day (BID) | ORAL | 0 refills | Status: AC

## 2021-01-05 NOTE — ED Triage Notes (Signed)
Frequency with urination, hard to start urinating, foul smelling urine. For 2 days.

## 2021-01-05 NOTE — ED Provider Notes (Signed)
[facility] EMERGENCY DEPT Provider Note   CSN: [id number] Arrival date & time: 01/05/21  0749     History Chief Complaint  Patient presents with   Urinary Frequency    Debra Rosario is a 34 y.o. female.  HPI Patient reports that she has malodorous urine.  She reports that she has typically had urinary tract infection with this symptom.  Mild suprapubic discomfort.  No back pain.  No fever.  No nausea no vomiting.  No vaginal discharge or bleeding.  Patient was seen by her PCP 11\18.  She had a Pap smear done at that time.  Also done was GC chlamydia testing empirically for some yellow discharge without risk factors.  Results are pending.  Patient does not have concern or suspicion for STD.    History reviewed. No pertinent past medical history.  Patient Active Problem List   Diagnosis Date Noted   Pap smear for cervical cancer screening 12/28/2020   Breast lump on left side at 6 o'clock position 11/29/2020   DYSURIA 10/19/2007    Past Surgical History:  Procedure Laterality Date   CESAREAN SECTION     TUBAL LIGATION       OB History   No obstetric history on file.     Family History  Family history unknown: Yes    Social History   Tobacco Use   Smoking status: Never   Smokeless tobacco: Never  Vaping Use   Vaping Use: Never used  Substance Use Topics   Alcohol use: Yes    Comment: occ   Drug use: Yes    Frequency: 1.0 times per week    Types: Marijuana    Home Medications Prior to Admission medications   Medication Sig Start Date End Date Taking? Authorizing Provider  nitrofurantoin, macrocrystal-monohydrate, (MACROBID) 100 MG capsule Take 1 capsule (100 mg total) by mouth 2 (two) times daily. Patient not taking: Reported on 12/28/2020 04/02/20   Chase Picket, MD    Allergies    Patient has no known allergies.  Review of Systems   Review of Systems  Physical Exam Updated Vital Signs BP 129/78   Pulse 62   Temp 98.1 F  (36.7 C)   Resp 20   LMP 12/07/2020   SpO2 100%   Physical Exam Constitutional: No fever no chills no malaise GI: No nausea vomiting or diarrhea ED Results / Procedures / Treatments   Labs (all labs ordered are listed, but only abnormal results are displayed) Labs Reviewed  URINALYSIS, ROUTINE W REFLEX MICROSCOPIC  PREGNANCY, URINE    EKG None  Radiology No results found.  Procedures Procedures   Medications Ordered in ED Medications - No data to display  ED Course  I have reviewed the triage vital signs and the nursing notes.  Pertinent labs & imaging results that were available during my care of the patient were reviewed by me and considered in my medical decision making (see chart for details).    MDM Rules/Calculators/A&P                           Patient presents with prior history of UTI with symptoms of malodorous urine.  Prior culture shows greater 100,000 E. coli from 6\11\2022.  Patient has consistently been positive for E. coli when symptomatic.  Will treat with Bactrim at this time. Final Clinical Impression(s) / ED Diagnoses Final diagnoses:  Acute cystitis without hematuria    Rx / DC  Orders ED Discharge Orders     None        Arby Barrette, MD 01/05/21 [phone]

## 2021-01-05 NOTE — Discharge Instructions (Addendum)
1.  A urine culture was done 2.  Take Bactrim as prescribed. 3.  Follow-up with your doctor for recheck in 5 to 7 days.

## 2021-01-05 NOTE — ED Notes (Signed)
Opened chart to verify pharmacy prescription was sent to.

## 2021-01-05 NOTE — ED Notes (Signed)
Lab called to add culture to urine. Dc instructions reviewed with patient. Patient voiced understanding. Dc with belongings.

## 2021-01-07 LAB — URINE CULTURE: Culture: >=100000 — AB

## 2021-01-08 LAB — CYTOLOGY - PAP
Chlamydia: NEGATIVE
Comment: NEGATIVE
Comment: NEGATIVE
Comment: NORMAL
Diagnosis: UNDETERMINED — AB
High risk HPV: NEGATIVE
Neisseria Gonorrhea: NEGATIVE

## 2021-01-10 ENCOUNTER — Encounter: Payer: Self-pay | Admitting: Family Medicine

## 2021-02-03 ENCOUNTER — Emergency Department (HOSPITAL_BASED_OUTPATIENT_CLINIC_OR_DEPARTMENT_OTHER)
Admission: EM | Admit: 2021-02-03 | Discharge: 2021-02-03 | Disposition: A | Discharge status: Home / Self Care | Payer: Medicaid Other | Attending: Emergency Medicine | Admitting: Emergency Medicine

## 2021-02-03 ENCOUNTER — Other Ambulatory Visit: Payer: Self-pay

## 2021-02-03 ENCOUNTER — Encounter (HOSPITAL_BASED_OUTPATIENT_CLINIC_OR_DEPARTMENT_OTHER): Payer: Self-pay

## 2021-02-03 DIAGNOSIS — Z5321 Procedure and treatment not carried out due to patient leaving prior to being seen by health care provider: Secondary | ICD-10-CM | POA: Insufficient documentation

## 2021-02-03 DIAGNOSIS — R300 Dysuria: Secondary | ICD-10-CM | POA: Diagnosis not present

## 2021-02-03 LAB — URINALYSIS, ROUTINE W REFLEX MICROSCOPIC
Bilirubin Urine: NEGATIVE
Glucose, UA: NEGATIVE mg/dL
Hgb urine dipstick: NEGATIVE
Ketones, ur: NEGATIVE mg/dL
Nitrite: NEGATIVE
Protein, ur: NEGATIVE mg/dL
Specific Gravity, Urine: 1.023 (ref 1.005–1.030)
pH: 7 (ref 5.0–8.0)

## 2021-02-03 LAB — PREGNANCY, URINE: Preg Test, Ur: NEGATIVE

## 2021-02-03 NOTE — ED Triage Notes (Signed)
She c/o dysuria and tells me "I've had uti's before". She denies fever/n/v/d and is in no distress.

## 2021-02-04 ENCOUNTER — Encounter (HOSPITAL_BASED_OUTPATIENT_CLINIC_OR_DEPARTMENT_OTHER): Payer: Self-pay

## 2021-02-04 ENCOUNTER — Emergency Department (HOSPITAL_BASED_OUTPATIENT_CLINIC_OR_DEPARTMENT_OTHER)
Admission: EM | Admit: 2021-02-04 | Discharge: 2021-02-04 | Disposition: A | Discharge status: Home / Self Care | Payer: Medicaid Other | Attending: Emergency Medicine | Admitting: Emergency Medicine

## 2021-02-04 ENCOUNTER — Other Ambulatory Visit: Payer: Self-pay

## 2021-02-04 DIAGNOSIS — R300 Dysuria: Secondary | ICD-10-CM | POA: Diagnosis present

## 2021-02-04 DIAGNOSIS — N3000 Acute cystitis without hematuria: Secondary | ICD-10-CM | POA: Insufficient documentation

## 2021-02-04 MED ORDER — CEPHALEXIN 250 MG PO CAPS
500.0000 mg | ORAL_CAPSULE | Freq: Once | ORAL | Status: AC
  Administered 2021-02-04: 07:00:00 500 mg via ORAL
  Filled 2021-02-04: qty 2

## 2021-02-04 MED ORDER — CEPHALEXIN 500 MG PO CAPS: 500.0000 mg | ORAL_CAPSULE | Freq: Three times a day (TID) | ORAL | 0 refills | Status: DC

## 2021-02-04 NOTE — ED Notes (Signed)
RN provided AVS using Teachback Method. Patient verbalizes understanding of Discharge Instructions. Opportunity for Questioning and Answers were provided by RN. Patient Discharged from ED ambulatory to Home. ° °

## 2021-02-04 NOTE — Discharge Instructions (Signed)
You were seen today for urinary symptoms.  You will be started on an antibiotic.  If you develop fevers or back pain, you should be reevaluated.

## 2021-02-04 NOTE — ED Triage Notes (Signed)
Patient here POV from Home with UTI Symptoms.  Patient was in ED yesterday but LWBS after providing UA.  No Pain. Mild Discomfort. NAD Noted during Triage. No Fevers. A&Ox4. GCS 15. Ambulatory.

## 2021-02-04 NOTE — ED Provider Notes (Signed)
[facility] EMERGENCY DEPT Provider Note   CSN: [id number] Arrival date & time: 02/04/21  [id number]     History Chief Complaint  Patient presents with   Dysuria    Debra Rosario is a 35 y.o. female.  HPI     This is a 35 year old female who presents with urinary symptoms.  Patient reports 2-day history of urinary frequency and dysuria.  No back pain or fevers.  Patient states she feels like she has a UTI.  Denies vaginal discharge.  She came to be evaluated yesterday but left before being evaluated.  Have reviewed the patient's urinalysis from yesterday.  21-50 white cells and few bacteria noted.  Patient not pregnant.  History reviewed. No pertinent past medical history.  Patient Active Problem List   Diagnosis Date Noted   Pap smear for cervical cancer screening 12/28/2020   Breast lump on left side at 6 o'clock position 11/29/2020   DYSURIA 10/19/2007    Past Surgical History:  Procedure Laterality Date   CESAREAN SECTION     TUBAL LIGATION       OB History   No obstetric history on file.     Family History  Family history unknown: Yes    Social History   Tobacco Use   Smoking status: Never   Smokeless tobacco: Never  Vaping Use   Vaping Use: Never used  Substance Use Topics   Alcohol use: Yes    Comment: occ   Drug use: Yes    Frequency: 1.0 times per week    Types: Marijuana    Home Medications Prior to Admission medications   Medication Sig Start Date End Date Taking? Authorizing Provider  cephALEXin (KEFLEX) 500 MG capsule Take 1 capsule (500 mg total) by mouth 3 (three) times daily. 02/04/21  Yes Quantia Grullon, Barbette Hair, MD  nitrofurantoin, macrocrystal-monohydrate, (MACROBID) 100 MG capsule Take 1 capsule (100 mg total) by mouth 2 (two) times daily. Patient not taking: Reported on 12/28/2020 04/02/20   Chase Picket, MD    Allergies    Patient has no known allergies.  Review of Systems   Review of Systems  Constitutional:   Negative for fever.  Respiratory:  Negative for shortness of breath.   Cardiovascular:  Negative for chest pain.  Gastrointestinal:  Negative for abdominal pain.  Genitourinary:  Positive for dysuria and frequency. Negative for flank pain.  All other systems reviewed and are negative.  Physical Exam Updated Vital Signs BP 117/74 (BP Location: Right Arm)    Pulse 72    Temp 97.9 F (36.6 C) (Oral)    Resp 12    LMP 01/25/2021 (Exact Date)    SpO2 99%   Physical Exam Vitals and nursing note reviewed.  Constitutional:      Appearance: She is well-developed. She is not ill-appearing.  HENT:     Head: Normocephalic and atraumatic.     Mouth/Throat:     Mouth: Mucous membranes are moist.  Eyes:     Pupils: Pupils are equal, round, and reactive to light.  Cardiovascular:     Rate and Rhythm: Normal rate and regular rhythm.     Heart sounds: Normal heart sounds.  Pulmonary:     Effort: Pulmonary effort is normal. No respiratory distress.     Breath sounds: No wheezing.  Abdominal:     Palpations: Abdomen is soft.     Tenderness: There is no abdominal tenderness. There is no right CVA tenderness or left CVA tenderness.  Musculoskeletal:  Cervical back: Neck supple.  Skin:    General: Skin is warm and dry.  Neurological:     Mental Status: She is alert and oriented to person, place, and time.  Psychiatric:        Mood and Affect: Mood normal.    ED Results / Procedures / Treatments   Labs (all labs ordered are listed, but only abnormal results are displayed) Labs Reviewed  URINE CULTURE    EKG None  Radiology No results found.  Procedures Procedures   Medications Ordered in ED Medications  cephALEXin (KEFLEX) capsule 500 mg (has no administration in time range)    ED Course  I have reviewed the triage vital signs and the nursing notes.  Pertinent labs & imaging results that were available during my care of the patient were reviewed by me and considered in my  medical decision making (see chart for details).    MDM Rules/Calculators/A&P                          Patient presents with urinary symptoms.  She is nontoxic and afebrile.  She has no signs or symptoms of pyelonephritis.  Her history is consistent with acute cystitis.  Urinalysis reviewed from yesterday and likely an early UTI.  I have sent the urine culture.  Will start on Keflex.  Do not feel she needs further work-up at this time.  Patient was given return precautions.  After history, exam, and medical workup I feel the patient has been appropriately medically screened and is safe for discharge home. Pertinent diagnoses were discussed with the patient. Patient was given return precautions.      Final Clinical Impression(s) / ED Diagnoses Final diagnoses:  Acute cystitis without hematuria    Rx / DC Orders ED Discharge Orders          Ordered    cephALEXin (KEFLEX) 500 MG capsule  3 times daily        02/04/21 0648             Saphyra Hutt, Mayer Masker, MD 02/04/21 [phone]

## 2021-02-06 LAB — URINE CULTURE: Culture: >=100000 — AB

## 2021-05-28 ENCOUNTER — Emergency Department (HOSPITAL_BASED_OUTPATIENT_CLINIC_OR_DEPARTMENT_OTHER)
Admission: EM | Admit: 2021-05-28 | Discharge: 2021-05-28 | Disposition: A | Discharge status: Home / Self Care | Payer: Medicaid Other | Attending: Emergency Medicine | Admitting: Emergency Medicine

## 2021-05-28 ENCOUNTER — Emergency Department (HOSPITAL_BASED_OUTPATIENT_CLINIC_OR_DEPARTMENT_OTHER): Payer: Medicaid Other | Admitting: Radiology

## 2021-05-28 ENCOUNTER — Other Ambulatory Visit: Payer: Self-pay

## 2021-05-28 ENCOUNTER — Encounter (HOSPITAL_BASED_OUTPATIENT_CLINIC_OR_DEPARTMENT_OTHER): Payer: Self-pay | Admitting: Obstetrics and Gynecology

## 2021-05-28 DIAGNOSIS — M25562 Pain in left knee: Secondary | ICD-10-CM | POA: Diagnosis present

## 2021-05-28 MED ORDER — IBUPROFEN 400 MG PO TABS
600.0000 mg | ORAL_TABLET | Freq: Once | ORAL | Status: AC
  Administered 2021-05-28: 600 mg via ORAL
  Filled 2021-05-28: qty 1

## 2021-05-28 NOTE — ED Provider Notes (Signed)
?[facility] ?Provider Note ? ? ?CSN: [id number] ?Arrival date & time: 05/28/21  0746 ? ?  ? ?History ? ?Chief Complaint  ?Patient presents with  ? Knee Pain  ? ? ?Debra Rosario is a 36 y.o. female. ? ? ?Knee Pain ?Patient presents for left knee discomfort.  Approximately 10 days ago, patient had a injury during which she struck the lower lateral aspect of her knee against a table.  She subsequently had some bruising which has resolved.  She has been able to ambulate on her leg since the injury.  She has continued to do physical exercise.  She has noticed that she has ongoing discomfort that seems to be worsened with climbing stairs.  She feels this discomfort on the superior aspect of her knee as well as the medial aspect.  Discomfort is mild.  She also feels that she has mild swelling.  She did do a workout prior to coming to the ED today.  She has not tried any knee braces, wraps, or any over-the-counter pain medicine.  She does not have any known chronic medical conditions. ?  ? ?Home Medications ?Prior to Admission medications   ?Medication Sig Start Date End Date Taking? Authorizing Provider  ?cephALEXin (KEFLEX) 500 MG capsule Take 1 capsule (500 mg total) by mouth 3 (three) times daily. 02/04/21   Horton, Mayer Masker, MD  ?nitrofurantoin, macrocrystal-monohydrate, (MACROBID) 100 MG capsule Take 1 capsule (100 mg total) by mouth 2 (two) times daily. ?Patient not taking: Reported on 12/28/2020 04/02/20   Merrilee Jansky, MD  ?   ? ?Allergies    ?Patient has no known allergies.   ? ?Review of Systems   ?Review of Systems  ?Musculoskeletal:  Positive for arthralgias.  ?All other systems reviewed and are negative. ? ?Physical Exam ?Updated Vital Signs ?BP 125/84 (BP Location: Right Arm)   Pulse 76   Temp 97.7 ?F (36.5 ?C) (Oral)   Resp 15   Ht 5\' 2"  (1.575 m)   Wt 75.8 kg   LMP 05/27/2021 (Exact Date)   SpO2 100%   BMI 30.54 kg/m?  ?Physical Exam ?Vitals and nursing note  reviewed.  ?Constitutional:   ?   General: She is not in acute distress. ?   Appearance: Normal appearance. She is well-developed and normal weight. She is not ill-appearing, toxic-appearing or diaphoretic.  ?HENT:  ?   Head: Normocephalic and atraumatic.  ?   Right Ear: External ear normal.  ?   Left Ear: External ear normal.  ?   Nose: Nose normal.  ?   Mouth/Throat:  ?   Mouth: Mucous membranes are moist.  ?Eyes:  ?   Extraocular Movements: Extraocular movements intact.  ?   Conjunctiva/sclera: Conjunctivae normal.  ?Cardiovascular:  ?   Rate and Rhythm: Normal rate and regular rhythm.  ?Pulmonary:  ?   Effort: Pulmonary effort is normal. No respiratory distress.  ?Abdominal:  ?   General: There is no distension.  ?   Palpations: Abdomen is soft.  ?Musculoskeletal:     ?   General: No swelling or deformity. Normal range of motion.  ?   Cervical back: Normal range of motion and neck supple.  ?Skin: ?   General: Skin is warm and dry.  ?   Capillary Refill: Capillary refill takes less than 2 seconds.  ?   Coloration: Skin is not jaundiced or pale.  ?Neurological:  ?   General: No focal deficit present.  ?  Mental Status: She is alert and oriented to person, place, and time.  ?   Cranial Nerves: No cranial nerve deficit.  ?   Sensory: No sensory deficit.  ?   Motor: No weakness.  ?   Coordination: Coordination normal.  ?Psychiatric:     ?   Mood and Affect: Mood normal.     ?   Behavior: Behavior normal.     ?   Thought Content: Thought content normal.     ?   Judgment: Judgment normal.  ? ? ?ED Results / Procedures / Treatments   ?Labs ?(all labs ordered are listed, but only abnormal results are displayed) ?Labs Reviewed - No data to display ? ?EKG ?None ? ?Radiology ?DG Knee Complete 4 Views Left ? ?Result Date: 05/28/2021 ?CLINICAL DATA:  Left knee pain after injury. EXAM: LEFT KNEE - COMPLETE 4+ VIEW COMPARISON:  None. FINDINGS: Four view study. No fracture or subluxation. No joint effusion appeared mild loss of  joint space medial compartment with trace spurring in the patellofemoral compartment. IMPRESSION: Mild degenerative changes without acute bony findings. Electronically Signed   By: Kennith Center M.D.   On: 05/28/2021 08:31   ? ?Procedures ?Procedures  ? ? ?Medications Ordered in ED ?Medications  ?ibuprofen (ADVIL) tablet 600 mg (600 mg Oral Given 05/28/21 0853)  ? ? ?ED Course/ Medical Decision Making/ A&P ?  ?                        ?Medical Decision Making ?Amount and/or Complexity of Data Reviewed ?Radiology: ordered. ? ? ?Healthy 36 year old female presents for ongoing intermittent knee discomfort following injury 1.5 weeks ago.  She has continued to be able to bear weight and do physical exercise.  Although she feels that there may be mild swelling, I do not appreciate any swelling on exam.  Areas of concern have no tenderness.  Active and passive range of motion is intact.  Patient did request x-ray to assess for possible underlying arthritis.  Ibuprofen was given for analgesia.  X-ray showed mild degenerative change only.  Patient was given knee brace to wear as needed for comfort.  She was given contact information for orthopedic follow-up as needed.  She was discharged in good condition. ? ? ? ? ? ? ? ?Final Clinical Impression(s) / ED Diagnoses ?Final diagnoses:  ?Acute pain of left knee  ? ? ?Rx / DC Orders ?ED Discharge Orders   ? ? None  ? ?  ? ? ?  ?Gloris Manchester, MD ?05/28/21 [phone] ? ?

## 2021-05-28 NOTE — ED Triage Notes (Signed)
Patient reports about a week and a half ago she hit her left knee on the edge of a table and since then there has been a lot of pressure in the knee. Patient reports she googled the symptoms she was having and became concerned.  ?

## 2021-07-16 ENCOUNTER — Encounter: Payer: Self-pay | Admitting: *Deleted

## 2021-10-03 ENCOUNTER — Other Ambulatory Visit: Payer: Self-pay

## 2021-10-03 ENCOUNTER — Other Ambulatory Visit (HOSPITAL_BASED_OUTPATIENT_CLINIC_OR_DEPARTMENT_OTHER): Payer: Self-pay

## 2021-10-03 ENCOUNTER — Encounter (HOSPITAL_BASED_OUTPATIENT_CLINIC_OR_DEPARTMENT_OTHER): Payer: Self-pay | Admitting: Obstetrics and Gynecology

## 2021-10-03 ENCOUNTER — Emergency Department (HOSPITAL_BASED_OUTPATIENT_CLINIC_OR_DEPARTMENT_OTHER)
Admission: EM | Admit: 2021-10-03 | Discharge: 2021-10-03 | Disposition: A | Discharge status: Home / Self Care | Payer: Medicaid Other | Attending: Emergency Medicine | Admitting: Emergency Medicine

## 2021-10-03 DIAGNOSIS — M542 Cervicalgia: Secondary | ICD-10-CM | POA: Diagnosis not present

## 2021-10-03 DIAGNOSIS — M79601 Pain in right arm: Secondary | ICD-10-CM | POA: Insufficient documentation

## 2021-10-03 MED ORDER — CYCLOBENZAPRINE HCL 10 MG PO TABS
10.0000 mg | ORAL_TABLET | Freq: Two times a day (BID) | ORAL | 0 refills | Status: DC | PRN
  Filled 2021-10-03: qty 20, 10d supply, fill #0

## 2021-10-03 NOTE — ED Provider Notes (Signed)
[facility] EMERG[facility] DEPT Provider Note   CSN: [id number] Arrival date & time: 10/03/21  1026     History Chief Complaint  Patient presents with   Neck Pain    HPI Debra Rosario is a 36 y.o. female presenting for right-sided upper back pain radiating into her right arm.  She states that the radial aspect of her right arm from her elbow to her mid forearm has had shooting pain over the past 3 days.  Last night it bothered her no matter where she moved her arm.  She states it is worse when she moves her head.  She denies any neck pain in particular.  She states that she sleeps with her arms over her head.  She woke up with her son today's prior to arrival. Patient otherwise ambulatory tolerating p.o. intake.  She has full range of motion of the arm but has shooting pains localizing fairly well to her right forearm.  No obvious skin changes.  Patient denies fevers or chills nausea or vomiting, syncope or shortness of breath.   Patient's recorded medical, surgical, social, medication list and allergies were reviewed in the Snapshot window as part of the initial history.   Review of Systems   Review of Systems  Constitutional:  Negative for chills and fever.  HENT:  Negative for ear pain and sore throat.   Eyes:  Negative for pain and visual disturbance.  Respiratory:  Negative for cough and shortness of breath.   Cardiovascular:  Negative for chest pain and palpitations.  Gastrointestinal:  Negative for abdominal pain and vomiting.  Genitourinary:  Negative for dysuria and hematuria.  Musculoskeletal:  Negative for arthralgias and back pain.  Skin:  Negative for color change and rash.  Neurological:  Negative for seizures and syncope.  All other systems reviewed and are negative.   Physical Exam Updated Vital Signs BP (!) 136/91 (BP Location: Left Arm)   Pulse (!) 55   Temp 97.6 F (36.4 C) (Oral)   Resp 16   Ht 5\' 2"  (1.575 m)   Wt 71.2 kg   SpO2 100%   BMI  28.72 kg/m  Physical Exam Vitals and nursing note reviewed.  Constitutional:      General: She is not in acute distress.    Appearance: She is well-developed.  HENT:     Head: Normocephalic and atraumatic.  Eyes:     Conjunctiva/sclera: Conjunctivae normal.  Cardiovascular:     Rate and Rhythm: Normal rate and regular rhythm.     Heart sounds: No murmur heard. Pulmonary:     Effort: Pulmonary effort is normal. No respiratory distress.     Breath sounds: Normal breath sounds.  Abdominal:     General: There is no distension.     Palpations: Abdomen is soft.     Tenderness: There is no abdominal tenderness. There is no right CVA tenderness or left CVA tenderness.  Musculoskeletal:        General: No swelling or tenderness. Normal range of motion.     Cervical back: Neck supple.  Skin:    General: Skin is warm and dry.  Neurological:     General: No focal deficit present.     Mental Status: She is alert and oriented to person, place, and time. Mental status is at baseline.     Cranial Nerves: No cranial nerve deficit.      ED Course/ Medical Decision Making/ A&P    Procedures Procedures   Medications Ordered in  ED Medications - No data to display  Medical Decision Making:    Debra Rosario is a 36 y.o. female who presented to the ED today with right forearm pain detailed above.     Patient placed on continuous vitals and telemetry monitoring while in ED which was reviewed periodically.   Complete initial physical exam performed, notably the patient  was hemodynamically stable no acute distress.  Complete neurologic exam with no focal deficits.  She has two-point tenderness in her right forearm but otherwise full range of motion.  She has palpable tenderness in her right upper back paraspinal on the right side..      Reviewed and confirmed nursing documentation for past medical history, family history, social history.    Initial Assessment:   Patient's history of  present illness and physical exam findings are most concerning for Saturday night palsy secondary to the way that she compressed her nerves while sleeping and residual neuropathy.  Alternatively she does have palpable tender muscles in her upper thoracic spine that may be related.  Considered thoracic spinal infection, disc herniation, meningitis, zoster or shingles.  These consider grossly less likely based on the current history of present illness and physical exam findings. Shared medical decision making patient regarding further work-up with cross-sectional imaging to rule out trauma.  She denies any concern with trauma at this time.  Ultimately, patient feels comfortable with therapeutic treatment with muscle relaxers and plan for reassessment in 3 days with PCP to ensure ongoing improvement and resolution.  Patient may require referral to sports medicine if she continues to have her ongoing neuropathic pains..  Disposition:  Based on the above findings, I believe patient is stable for discharge.    Patient/family educated about specific return precautions for given chief complaint and symptoms.  Patient/family educated about follow-up with PCP.     Patient/family expressed understanding of return precautions and need for follow-up. Patient spoken to regarding all imaging and laboratory results and appropriate follow up for these results. All education provided in verbal form with additional information in written form. Time was allowed for answering of patient questions. Patient discharged.    Emergency Department Medication Summary:   Medications - No data to display      Clinical Impression:  1. Right arm pain      Discharge   Final Clinical Impression(s) / ED Diagnoses Final diagnoses:  Right arm pain    Rx / DC Orders ED Discharge Orders     None         Glyn Ade, MD 10/03/21 [phone]

## 2021-10-03 NOTE — ED Triage Notes (Signed)
Patient reports neck pain that she is concerned is a pinched nerve. Patient reports no neck pain, states that pain radiates down to mid forearm. Patient reports she sleeps with her arms over her head and the pain is constant throughout the day. States she has been taking tylenol and using heating pad without relief.

## 2022-01-08 ENCOUNTER — Encounter (HOSPITAL_COMMUNITY): Payer: Self-pay

## 2022-01-08 ENCOUNTER — Ambulatory Visit (HOSPITAL_COMMUNITY)
Admission: EM | Admit: 2022-01-08 | Discharge: 2022-01-08 | Disposition: A | Discharge status: Home / Self Care | Payer: Medicaid Other | Attending: Family Medicine | Admitting: Family Medicine

## 2022-01-08 DIAGNOSIS — Z113 Encounter for screening for infections with a predominantly sexual mode of transmission: Secondary | ICD-10-CM | POA: Insufficient documentation

## 2022-01-08 DIAGNOSIS — N3090 Cystitis, unspecified without hematuria: Secondary | ICD-10-CM | POA: Diagnosis present

## 2022-01-08 DIAGNOSIS — Z3202 Encounter for pregnancy test, result negative: Secondary | ICD-10-CM

## 2022-01-08 LAB — POCT URINALYSIS DIPSTICK, ED / UC
Bilirubin Urine: NEGATIVE
Glucose, UA: NEGATIVE mg/dL
Ketones, ur: NEGATIVE mg/dL
Nitrite: POSITIVE — AB
Protein, ur: NEGATIVE mg/dL
Specific Gravity, Urine: 1.015 (ref 1.005–1.030)
Urobilinogen, UA: 0.2 mg/dL (ref 0.0–1.0)
pH: 7 (ref 5.0–8.0)

## 2022-01-08 LAB — POC URINE PREG, ED: Preg Test, Ur: NEGATIVE

## 2022-01-08 LAB — HIV ANTIBODY (ROUTINE TESTING W REFLEX): HIV Screen 4th Generation wRfx: NONREACTIVE

## 2022-01-08 MED ORDER — CEPHALEXIN 500 MG PO CAPS: 500.0000 mg | ORAL_CAPSULE | Freq: Two times a day (BID) | ORAL | 0 refills | Status: DC

## 2022-01-08 NOTE — Discharge Instructions (Signed)
In addition to a urine culture, we have sent testing for various causes of vaginal infections, including sexually transmitted infections. We will notify you of any positive results once they are received. If required, we will prescribe any medications you might need.  Please refrain from all sexual activity for at least the next seven days.

## 2022-01-08 NOTE — ED Triage Notes (Signed)
Pt reports she has an odor to urine which happens when she has a UTI along with cramping. Pt also request to be tested for BV.

## 2022-01-08 NOTE — ED Provider Notes (Signed)
  [facility]    ASSESSMENT & PLAN:  1. Cystitis   2. Screening for STDs (sexually transmitted diseases)    Begin: Meds ordered this encounter  Medications   cephALEXin (KEFLEX) 500 MG capsule    Sig: Take 1 capsule (500 mg total) by mouth 2 (two) times daily.    Dispense:  10 capsule    Refill:  0   UPT negative. Vaginal cytology, HIV/RPR pending.0 No signs of pyelonephritis. Discussed. Urine culture sent. Will notify patient of any significant results. Will follow up with her PCP or here if not showing improvement over the next 48 hours, sooner if needed.  Outlined signs and symptoms indicating need for more acute intervention. Patient verbalized understanding. After Visit Summary given.  SUBJECTIVE:  Debra Rosario is a 36 y.o. female who complains of urine odor, mild urinary freq, mild abd "cramping"; x sev days. H/O UTI with same symptoms. Without associated flank pain, fever, chills, vaginal discharge or bleeding. Gross hematuria: not present. No specific aggravating or alleviating factors reported. No LE edema. Normal PO intake without n/v/d. Ambulatory without difficulty. OTC treatment: none. Does desire STD screening.  LMP: Patient's last menstrual period was 12/28/2021.  OBJECTIVE:  Vitals:   01/08/22 1210  BP: 121/84  Pulse: 70  Resp: 18  Temp: 98.7 F (37.1 C)  SpO2: 100%   General appearance: alert; no distress HENT: oropharynx: moist Lungs: unlabored respirations Abdomen: soft, non-tender; bowel sounds normal; no masses or organomegaly; no guarding or rebound tenderness Back: no CVA tenderness Extremities: no edema; symmetrical with no gross deformities Skin: warm and dry Neurologic: normal gait Psychological: alert and cooperative; normal mood and affect  Labs Reviewed  POCT URINALYSIS DIPSTICK, ED / UC - Abnormal; Notable for the following components:      Result Value   Hgb urine dipstick TRACE (*)    Nitrite POSITIVE (*)     Leukocytes,Ua TRACE (*)    All other components within normal limits  RPR  HIV ANTIBODY (ROUTINE TESTING W REFLEX)  POC URINE PREG, ED  CERVICOVAGINAL ANCILLARY ONLY    No Known Allergies  History reviewed. No pertinent past medical history. Social History   Socioeconomic History   Marital status: Single    Spouse name: Not on file   Number of children: Not on file   Years of education: Not on file   Highest education level: Not on file  Occupational History   Not on file  Tobacco Use   Smoking status: Never    Passive exposure: Never   Smokeless tobacco: Never  Vaping Use   Vaping Use: Never used  Substance and Sexual Activity   Alcohol use: Yes    Comment: occ   Drug use: Yes    Frequency: 1.0 times per week    Types: Marijuana   Sexual activity: Yes    Birth control/protection: Surgical  Other Topics Concern   Not on file  Social History Narrative   Not on file   Social Determinants of Health   Financial Resource Strain: Not on file  Food Insecurity: Not on file  Transportation Needs: Not on file  Physical Activity: Not on file  Stress: Not on file  Social Connections: Not on file  Intimate Partner Violence: Not on file   Family History  Family history unknown: Yes        Mardella Layman, MD 01/08/22 1731

## 2022-01-09 ENCOUNTER — Telehealth (HOSPITAL_COMMUNITY): Payer: Self-pay | Admitting: Emergency Medicine

## 2022-01-09 LAB — RPR: RPR Ser Ql: NONREACTIVE

## 2022-01-09 LAB — CERVICOVAGINAL ANCILLARY ONLY
Bacterial Vaginitis (gardnerella): POSITIVE — AB
Candida Glabrata: NEGATIVE
Candida Vaginitis: NEGATIVE
Chlamydia: NEGATIVE
Comment: NEGATIVE
Comment: NEGATIVE
Comment: NEGATIVE
Comment: NEGATIVE
Comment: NEGATIVE
Comment: NORMAL
Neisseria Gonorrhea: NEGATIVE
Trichomonas: NEGATIVE

## 2022-01-09 MED ORDER — METRONIDAZOLE 500 MG PO TABS: 500.0000 mg | ORAL_TABLET | Freq: Two times a day (BID) | ORAL | 0 refills | Status: DC

## 2022-02-20 ENCOUNTER — Other Ambulatory Visit: Payer: Self-pay

## 2022-02-20 ENCOUNTER — Emergency Department (HOSPITAL_BASED_OUTPATIENT_CLINIC_OR_DEPARTMENT_OTHER)
Admission: EM | Admit: 2022-02-20 | Discharge: 2022-02-20 | Disposition: A | Discharge status: Home / Self Care | Payer: Medicaid Other | Attending: Emergency Medicine | Admitting: Emergency Medicine

## 2022-02-20 ENCOUNTER — Encounter (HOSPITAL_BASED_OUTPATIENT_CLINIC_OR_DEPARTMENT_OTHER): Payer: Self-pay

## 2022-02-20 DIAGNOSIS — N3000 Acute cystitis without hematuria: Secondary | ICD-10-CM | POA: Insufficient documentation

## 2022-02-20 DIAGNOSIS — R109 Unspecified abdominal pain: Secondary | ICD-10-CM | POA: Diagnosis present

## 2022-02-20 LAB — URINALYSIS, ROUTINE W REFLEX MICROSCOPIC
Bilirubin Urine: NEGATIVE
Glucose, UA: NEGATIVE mg/dL
Hgb urine dipstick: NEGATIVE
Ketones, ur: NEGATIVE mg/dL
Nitrite: NEGATIVE
Protein, ur: 30 mg/dL — AB
Specific Gravity, Urine: 1.037 — ABNORMAL HIGH (ref 1.005–1.030)
WBC, UA: >50 WBC/hpf — ABNORMAL HIGH (ref 0–5)
pH: 6 (ref 5.0–8.0)

## 2022-02-20 LAB — PREGNANCY, URINE: Preg Test, Ur: NEGATIVE

## 2022-02-20 MED ORDER — CEPHALEXIN 500 MG PO CAPS: 500.0000 mg | ORAL_CAPSULE | Freq: Two times a day (BID) | ORAL | 0 refills | Status: DC

## 2022-02-20 NOTE — ED Provider Notes (Signed)
[facility] DE[facility] Provider Note   CSN: [id number] Arrival date & time: 02/20/22  1450     History  No chief complaint on file.   Debra Rosario is a 37 y.o. female with h/o UTIs who presents with malodorous urine, abdominal cramping that started 2 days ago. Reports similar when she had her last UTI. Patient denies chills.  She denies any flank pain.  This feels exactly like her previous UTI.  She endorses unprotected sexual intercourse but denies any vaginal symptoms like discharge, itching, or burning. Menstrual period is starting right now.  She would like to have G/C testing but denies HIV/RPR testing.   Per chart review patient was seen and diagnosed with cystitis most recently 01/08/22, 02/04/21, 01/05/21. Cultures have grown E coli resistant in the past to ampicillin, ampicillin/sulbactam, cefazolin, but otherwise pan sensitive. Patient was treated last time with keflex.   HPI     Home Medications Prior to Admission medications   Medication Sig Start Date End Date Taking? Authorizing Provider  cephALEXin (KEFLEX) 500 MG capsule Take 1 capsule (500 mg total) by mouth 2 (two) times daily. 02/20/22   Audley Hose, MD  cyclobenzaprine (FLEXERIL) 10 MG tablet Take 1 tablet (10 mg total) by mouth 2 (two) times daily as needed for muscle spasms. 10/03/21   Tretha Sciara, MD  metroNIDAZOLE (FLAGYL) 500 MG tablet Take 1 tablet (500 mg total) by mouth 2 (two) times daily. 01/09/22   LampteyMyrene Galas, MD      Allergies    Patient has no known allergies.    Review of Systems   Review of Systems Review of systems Negative for f/c.  A 10 point review of systems was performed and is negative unless otherwise reported in HPI.  Physical Exam Updated Vital Signs BP 110/69   Pulse 76   Temp 98 F (36.7 C)   Resp 16   LMP 02/20/2022   SpO2 100%  Physical Exam General: Normal appearing female, lying in bed.  HEENT: Sclera anicteric, MMM, trachea midline.   Cardiology: RRR, no murmurs/rubs/gallops.  Resp: Normal respiratory rate and effort.  Abd: Soft, non-tender, non-distended. No rebound tenderness or guarding.  GU: Deferred. MSK: No peripheral edema or signs of trauma. Skin: warm, dry. No rashes or lesions. Back: No CVA tenderness Neuro: A&Ox4, CNs II-XII grossly intact. MAEs. Sensation grossly intact.  Psych: Normal mood and affect.   ED Results / Procedures / Treatments   Labs (all labs ordered are listed, but only abnormal results are displayed) Labs Reviewed  URINALYSIS, ROUTINE W REFLEX MICROSCOPIC - Abnormal; Notable for the following components:      Result Value   Specific Gravity, Urine 1.037 (*)    Protein, ur 30 (*)    Leukocytes,Ua MODERATE (*)    WBC, UA >50 (*)    Bacteria, UA FEW (*)    All other components within normal limits  PREGNANCY, URINE  GC/CHLAMYDIA PROBE AMP ([place]) NOT AT [facility]    EKG None  Radiology No results found.  Procedures Procedures    Medications Ordered in ED Medications - No data to display  ED Course/ Medical Decision Making/ A&P                          Medical Decision Making Amount and/or Complexity of Data Reviewed Labs: ordered. Decision-making details documented in ED Course.    MDM:    Consider UTI at top of differential.  Given unprotected sexual intercourse will test for G/C for possible urethritis but patient has h/o UTIs and this is similar to that. She has no f/c or vitals sign abnormalities to indicate sepsis. No flank pain or CVA tenderness to indicate pyelonephritis. She has no vaginal symptoms to indicate cervicitis or need for a pelvic exam. Will test for pregnancy as well. Otherwise in her normal state of health and very well-appearing.   Clinical Course as of 02/20/22 1525  Thu Feb 20, 2022  1515 Urinalysis, Routine w reflex microscopic Urine, Clean Catch(!) +UTI [HN]  1515 Preg Test, Ur: NEGATIVE Neg [HN]    Clinical Course User Index [HN]  Audley Hose, MD    Labs: I Ordered, and personally interpreted labs.  The pertinent results include:  UA with + UTI, preg negative  Additional history obtained from chart review  Social Determinants of Health: Patient lives independently   Disposition:  Will prescribe keflex as this has worked well for patient in the past. G/C added on to urine sample and discussed with patient that this will result tomorrow. Do not believe we need to presumptively treat at this time, patient states she will follow It up on mychart. Will DC w/ discharge instructions/return precautions.   Co morbidities that complicate the patient evaluation History reviewed. No pertinent past medical history.   Medicines Meds ordered this encounter  Medications   cephALEXin (KEFLEX) 500 MG capsule    Sig: Take 1 capsule (500 mg total) by mouth 2 (two) times daily.    Dispense:  10 capsule    Refill:  0    I have reviewed the patients home medicines and have made adjustments as needed  Problem List / ED Course: Problem List Items Addressed This Visit   None Visit Diagnoses     Acute cystitis without hematuria    -  Primary                   This note was created using dictation software, which may contain spelling or grammatical errors.    Audley Hose, MD 02/20/22 [phone]

## 2022-02-20 NOTE — ED Triage Notes (Signed)
Odorous urine and some abdominal cramping. Reports similar when she had her last uti. Started 2 day ago

## 2022-02-20 NOTE — ED Notes (Signed)
Pt verbalized understanding of d/c instructions, meds, and followup care. Denies questions. VSS, no distress noted. Steady gait to exit with all belongings.  ?

## 2022-02-20 NOTE — Discharge Instructions (Addendum)
Thank you for coming to [facility] Emergency Department. You were seen for urinary symptoms. We did an exam, labs, and these showed a UTI. We will treat you with the antibiotic keflex twice per day for 5 days. Your gonorrhea/chlamydia test will result in the next 24-48 hours and you can check on mychart, though someone should contact you if it is positive.   Please call the number [facility] to establish with a primary care provider.  Do not hesitate to return to the ED or call 911 if you experience: -Worsening symptoms -Lightheadedness, passing out -Fevers/chills -Anything else that concerns you

## 2022-02-23 LAB — GC/CHLAMYDIA PROBE AMP (~~LOC~~) NOT AT ARMC
Chlamydia: NEGATIVE
Comment: NEGATIVE
Comment: NORMAL
Neisseria Gonorrhea: NEGATIVE

## 2022-06-15 ENCOUNTER — Emergency Department (HOSPITAL_BASED_OUTPATIENT_CLINIC_OR_DEPARTMENT_OTHER)
Admission: EM | Admit: 2022-06-15 | Discharge: 2022-06-15 | Disposition: A | Discharge status: Home / Self Care | Attending: Emergency Medicine | Admitting: Emergency Medicine

## 2022-06-15 ENCOUNTER — Encounter (HOSPITAL_BASED_OUTPATIENT_CLINIC_OR_DEPARTMENT_OTHER): Payer: Self-pay | Admitting: Emergency Medicine

## 2022-06-15 ENCOUNTER — Emergency Department (HOSPITAL_BASED_OUTPATIENT_CLINIC_OR_DEPARTMENT_OTHER): Payer: Medicaid Other | Admitting: Radiology

## 2022-06-15 DIAGNOSIS — S93401A Sprain of unspecified ligament of right ankle, initial encounter: Secondary | ICD-10-CM | POA: Diagnosis not present

## 2022-06-15 DIAGNOSIS — M25571 Pain in right ankle and joints of right foot: Secondary | ICD-10-CM | POA: Diagnosis present

## 2022-06-15 DIAGNOSIS — X501XXA Overexertion from prolonged static or awkward postures, initial encounter: Secondary | ICD-10-CM | POA: Insufficient documentation

## 2022-06-15 NOTE — ED Triage Notes (Signed)
Pt arrives pov, slow gait with c/o RT ankle injury x 2 days pta. Pt reports "rolling ankle" after stepping in hole. PT c/o lateral foot, ankle and lower leg pain

## 2022-06-15 NOTE — Discharge Instructions (Signed)
You were seen for your ankle sprain in the emergency department.   At home, please use the Aircast we have given you.  You may weight-bear as tolerated.  Use Tylenol and ibuprofen for your pain.  Please elevate your ankle and use ice to limit the swelling.  Check your MyChart online for the results of any tests that had not resulted by the time you left the emergency department.   Please follow-up with sports medicine in 1 to 2 weeks.  Return immediately to the emergency department if you experience any concerning symptoms.  Thank you for visiting our Emergency Department. It was a pleasure taking care of you today.

## 2022-06-15 NOTE — ED Provider Notes (Signed)
[place] EMERGENCY DEPARTMENT AT [facility] Provider Note   CSN: [id number] Arrival date & time: 06/15/22  0809     History  Chief Complaint  Patient presents with   Ankle Injury    Debra Rosario is a 36 y.o. female.  37 year old previously healthy who presents emergency department with right foot and ankle pain.  2 days ago he was working and inverted her right ankle.  Says that since then has had some mild pain and bruising of her foot.  Still is able to ambulate without significant discomfort.  No surgeries to her foot.  Works as a Paramedic.       Home Medications Prior to Admission medications   Medication Sig Start Date End Date Taking? Authorizing Provider  cephALEXin (KEFLEX) 500 MG capsule Take 1 capsule (500 mg total) by mouth 2 (two) times daily. 02/20/22   Loetta Rough, MD  cyclobenzaprine (FLEXERIL) 10 MG tablet Take 1 tablet (10 mg total) by mouth 2 (two) times daily as needed for muscle spasms. 10/03/21   Glyn Ade, MD  metroNIDAZOLE (FLAGYL) 500 MG tablet Take 1 tablet (500 mg total) by mouth 2 (two) times daily. 01/09/22   LampteyBritta Mccreedy, MD      Allergies    Patient has no known allergies.    Review of Systems   Review of Systems  Physical Exam Updated Vital Signs BP 111/76 (BP Location: Right Arm)   Pulse 97   Temp 98.6 F (37 C) (Oral)   Resp 16   Wt 68 kg   LMP 05/17/2022   SpO2 100%   BMI 27.44 kg/m  Physical Exam Vitals and nursing note reviewed.  Constitutional:      General: She is not in acute distress.    Appearance: She is well-developed.  HENT:     Head: Normocephalic and atraumatic.     Right Ear: External ear normal.     Left Ear: External ear normal.     Nose: Nose normal.  Eyes:     Extraocular Movements: Extraocular movements intact.     Conjunctiva/sclera: Conjunctivae normal.     Pupils: Pupils are equal, round, and reactive to light.  Cardiovascular:     Comments: DP pulses 2+  bilaterally Pulmonary:     Effort: Pulmonary effort is normal. No respiratory distress.  Musculoskeletal:     Cervical back: Normal range of motion and neck supple.     Right lower leg: No edema.     Left lower leg: No edema.     Comments: Tenderness to palpation of right distal posterior lateral malleolus.  Tenderness to palpation of proximal right fifth metatarsal.  Both feet appear warm and well-perfused.  Minimal effusion of the right ankle with some bruising to the dorsum of the right foot.  Skin:    General: Skin is warm and dry.  Neurological:     Mental Status: She is alert and oriented to person, place, and time. Mental status is at baseline.  Psychiatric:        Mood and Affect: Mood normal.     ED Results / Procedures / Treatments   Labs (all labs ordered are listed, but only abnormal results are displayed) Labs Reviewed - No data to display  EKG None  Radiology DG Ankle Complete Right  Result Date: 06/15/2022 CLINICAL DATA:  Injury 2 days ago.  Posterior tibial region pain. EXAM: RIGHT ANKLE - COMPLETE 3+ VIEW COMPARISON:  None Available. FINDINGS: No fracture.  No bone lesion. Ankle joint normally spaced and aligned. Soft tissues are unremarkable. IMPRESSION: Negative. Electronically Signed   By: Amie Portland M.D.   On: 06/15/2022 09:04   DG Foot Complete Right  Result Date: 06/15/2022 CLINICAL DATA:  Injury 2 days ago.  Lateral foot pain. EXAM: RIGHT FOOT COMPLETE - 3+ VIEW COMPARISON:  None Available. FINDINGS: No fracture.  No bone lesion. Joints normally spaced and aligned. Small plantar calcaneal spur. Soft tissues are unremarkable. IMPRESSION: 1. No fracture or acute finding. Electronically Signed   By: Amie Portland M.D.   On: 06/15/2022 09:03    Procedures Procedures   Medications Ordered in ED Medications - No data to display  ED Course/ Medical Decision Making/ A&P                             Medical Decision Making Amount and/or Complexity of Data  Reviewed Radiology: ordered.   Debra Rosario is a 37 y.o. female who presents emergency department with right foot and ankle pain  Initial Ddx:  Ankle sprain, distal fibular fracture, proximal fifth metatarsal fracture  MDM:  Concern about ankle sprain given the patient's mechanism and the fact that she still able to bear weight.  Feel this would be more difficult with a distal fibular fracture but does have tenderness to palpation in that location so we will obtain x-rays.  Could also have a Jones or pseudo Jones fracture of her proximal fifth metatarsal so we will obtain x-rays as well.  Patient declined pain medication at this time.  Plan:  Right foot and ankle x-ray  ED Summary/Re-evaluation:  Patient had x-rays that did not show any evidence of fracture.  Feel her symptoms are likely due to sprain.  Patient declined crutches.  Was given a work note for the next week since she has to walk frequently for her job.  Will also have her follow-up with sports medicine.  This patient presents to the ED for concern of complaints listed in HPI, this involves an extensive number of treatment options, and is a complaint that carries with it a high risk of complications and morbidity. Disposition including potential need for admission considered.   Dispo: DC Home. Return precautions discussed including, but not limited to, those listed in the AVS. Allowed pt time to ask questions which were answered fully prior to dc.  Records reviewed Outpatient Clinic Notes I independently reviewed the following imaging with scope of interpretation limited to determining acute life threatening conditions related to emergency care: Extremity x-ray(s) and agree with the radiologist interpretation with the following exceptions: none I have reviewed the patients home medications and made adjustments as needed  Final Clinical Impression(s) / ED Diagnoses Final diagnoses:  Sprain of right ankle, unspecified  ligament, initial encounter    Rx / DC Orders ED Discharge Orders     None         Rondel Baton, MD 06/15/22 [phone]

## 2022-07-31 ENCOUNTER — Other Ambulatory Visit: Payer: Self-pay

## 2022-07-31 ENCOUNTER — Other Ambulatory Visit (HOSPITAL_BASED_OUTPATIENT_CLINIC_OR_DEPARTMENT_OTHER): Payer: Self-pay

## 2022-07-31 ENCOUNTER — Emergency Department (HOSPITAL_BASED_OUTPATIENT_CLINIC_OR_DEPARTMENT_OTHER)
Admission: EM | Admit: 2022-07-31 | Discharge: 2022-07-31 | Disposition: A | Discharge status: Home / Self Care | Payer: Medicaid Other | Attending: Emergency Medicine | Admitting: Emergency Medicine

## 2022-07-31 ENCOUNTER — Encounter (HOSPITAL_BASED_OUTPATIENT_CLINIC_OR_DEPARTMENT_OTHER): Payer: Self-pay | Admitting: Emergency Medicine

## 2022-07-31 DIAGNOSIS — R300 Dysuria: Secondary | ICD-10-CM | POA: Insufficient documentation

## 2022-07-31 DIAGNOSIS — R109 Unspecified abdominal pain: Secondary | ICD-10-CM | POA: Insufficient documentation

## 2022-07-31 DIAGNOSIS — R8290 Unspecified abnormal findings in urine: Secondary | ICD-10-CM

## 2022-07-31 LAB — PREGNANCY, URINE: Preg Test, Ur: NEGATIVE

## 2022-07-31 LAB — URINALYSIS, ROUTINE W REFLEX MICROSCOPIC
Bacteria, UA: NONE SEEN
Bilirubin Urine: NEGATIVE
Glucose, UA: NEGATIVE mg/dL
Hgb urine dipstick: NEGATIVE
Ketones, ur: NEGATIVE mg/dL
Nitrite: NEGATIVE
Protein, ur: NEGATIVE mg/dL
Specific Gravity, Urine: 1.024 (ref 1.005–1.030)
pH: 7 (ref 5.0–8.0)

## 2022-07-31 LAB — WET PREP, GENITAL
Clue Cells Wet Prep HPF POC: NONE SEEN
Sperm: NONE SEEN
Trich, Wet Prep: NONE SEEN
WBC, Wet Prep HPF POC: <10 (ref <10)
Yeast Wet Prep HPF POC: NONE SEEN

## 2022-07-31 MED ORDER — CEPHALEXIN 500 MG PO CAPS
500.0000 mg | ORAL_CAPSULE | Freq: Two times a day (BID) | ORAL | 0 refills | Status: AC
  Filled 2022-07-31: qty 14, 7d supply, fill #0

## 2022-07-31 NOTE — ED Notes (Signed)
Pt given discharge instructions and reviewed prescriptions. Opportunities given for questions. Pt verbalizes understanding. Anallely Rosell R, RN 

## 2022-07-31 NOTE — ED Triage Notes (Signed)
Pt arrives pov, steady gait, endorses concern for UTI, c/o foul smelling urine and lower ABD cramping. Request to be checked for BV as well.

## 2022-07-31 NOTE — Discharge Instructions (Signed)
You were seen in the ER for abdominal cramping.  As we discussed, your urine sample was not very convincing for a urinary tract infection, however with your symptoms being similar to ones you've had prior, I think it is reasonable to go ahead and treat.  I have prescribed the antibiotic to your pharmacy. I've also sent a urine culture. If it does not grow out any bacteria, you may get a call from the hospital telling you to discontinue your antibiotics.   Your testing for bacterial vaginosis was negative. You can follow up the results of your gonorrhea and chlamydia testing on MyChart. If either are positive, please go to the health department for necessary treatment. I've attached their address and contact information.  Continue to monitor how you're doing and return to the ER for new or worsening symptoms.

## 2022-07-31 NOTE — ED Provider Notes (Signed)
[place] EMERGENCY DEPARTMENT AT [facility] Provider Note   CSN: [id number] Arrival date & time: 07/31/22  6578     History  Chief Complaint  Patient presents with   Abdominal Cramping    Debra Rosario is a 37 y.o. female with no significant PMH who presents to the ER complaining of foul smelling urine and lower abdominal cramping for several days. Reports concern for UTI and bacterial vaginosis. No dysuria, but states she usually doesn't have that with UTI's. Has clear thin vaginal discharge, no bleeding. No fever. She doesn't have specific concerns about STD's but would like to be tested today.    Abdominal Cramping Associated symptoms include abdominal pain.       Home Medications Prior to Admission medications   Medication Sig Start Date End Date Taking? Authorizing Provider  cephALEXin (KEFLEX) 500 MG capsule Take 1 capsule (500 mg total) by mouth 2 (two) times daily for 7 days. 07/31/22 08/07/22 Yes Beckham Capistran T, PA-C  cyclobenzaprine (FLEXERIL) 10 MG tablet Take 1 tablet (10 mg total) by mouth 2 (two) times daily as needed for muscle spasms. 10/03/21   Glyn Ade, MD  metroNIDAZOLE (FLAGYL) 500 MG tablet Take 1 tablet (500 mg total) by mouth 2 (two) times daily. 01/09/22   LampteyBritta Mccreedy, MD      Allergies    Patient has no known allergies.    Review of Systems   Review of Systems  Constitutional:  Negative for fever.  Gastrointestinal:  Positive for abdominal pain.  Genitourinary:  Positive for dysuria and vaginal discharge. Negative for hematuria, menstrual problem and vaginal bleeding.  All other systems reviewed and are negative.   Physical Exam Updated Vital Signs BP 115/79   Pulse 69   Temp 98.6 F (37 C) (Oral)   Resp 16   Ht 5\' 2"  (1.575 m)   Wt 68 kg   LMP 07/13/2022   SpO2 100%   BMI 27.44 kg/m  Physical Exam Vitals and nursing note reviewed.  Constitutional:      Appearance: Normal appearance.  HENT:     Head:  Normocephalic and atraumatic.  Eyes:     Conjunctiva/sclera: Conjunctivae normal.  Pulmonary:     Effort: Pulmonary effort is normal. No respiratory distress.  Abdominal:     General: Abdomen is flat.     Palpations: Abdomen is soft.     Tenderness: There is no abdominal tenderness.  Skin:    General: Skin is warm and dry.  Neurological:     Mental Status: She is alert.  Psychiatric:        Mood and Affect: Mood normal.        Behavior: Behavior normal.     ED Results / Procedures / Treatments   Labs (all labs ordered are listed, but only abnormal results are displayed) Labs Reviewed  URINALYSIS, ROUTINE W REFLEX MICROSCOPIC - Abnormal; Notable for the following components:      Result Value   Leukocytes,Ua SMALL (*)    All other components within normal limits  WET PREP, GENITAL  URINE CULTURE  PREGNANCY, URINE  GC/CHLAMYDIA PROBE AMP ([place]) NOT AT [facility]    EKG None  Radiology No results found.  Procedures Procedures    Medications Ordered in ED Medications - No data to display  ED Course/ Medical Decision Making/ A&P  Medical Decision Making Amount and/or Complexity of Data Reviewed Labs: ordered.   This patient is a 37 y.o. female  who presents to the ED for concern of abdominal cramping, foul smelling urine.   Differential diagnoses prior to evaluation: The emergent differential diagnosis includes, but is not limited to,  UTI, STD, PID, pregnancy . This is not an exhaustive differential.   Past Medical History / Co-morbidities / Social History: No significant PMH  Physical Exam: Physical exam performed. The pertinent findings include: normal vitals, no distress, abdomen soft and non-tender  Lab Tests/Imaging studies: I personally interpreted labs/imaging and the pertinent results include:  UA with small leukocytes, 21-50 WBCs, no bacteria. Pregnancy and wet prep negative. GC and urine culture pending at time of  discharge.    Disposition: After consideration of the diagnostic results and the patients response to treatment, I feel that emergency department workup does not suggest an emergent condition requiring admission or immediate intervention beyond what has been performed at this time. The plan is: discharge to home with antibiotics for possible UTI. Although UA is not convincing for infection, patient reports her symptoms are the same as her prior UTI's. Will send for culture. Obtained STD testing, patient understands to follow up results and get necessary treatment if testing is positive. Abdominal exam nonsurgical and patient clinically well appearing, no SIRS criteria, not requiring futher workup at this time. The patient is safe for discharge and has been instructed to return immediately for worsening symptoms, change in symptoms or any other concerns.  Final Clinical Impression(s) / ED Diagnoses Final diagnoses:  Malodorous urine  Abdominal cramping    Rx / DC Orders ED Discharge Orders          Ordered    cephALEXin (KEFLEX) 500 MG capsule  2 times daily        07/31/22 1035           Portions of this report may have been transcribed using voice recognition software. Every effort was made to ensure accuracy; however, inadvertent computerized transcription errors may be present.    Su Monks, PA-C 07/31/22 1045    Tanda Rockers A, DO 08/01/22 [phone]

## 2022-07-31 NOTE — ED Notes (Signed)
Triage delay, pt in restroom 

## 2022-08-01 LAB — GC/CHLAMYDIA PROBE AMP (~~LOC~~) NOT AT ARMC
Chlamydia: NEGATIVE
Comment: NEGATIVE
Comment: NORMAL
Neisseria Gonorrhea: NEGATIVE

## 2022-08-02 LAB — URINE CULTURE: Culture: 80000 — AB

## 2022-08-03 ENCOUNTER — Telehealth (HOSPITAL_BASED_OUTPATIENT_CLINIC_OR_DEPARTMENT_OTHER): Payer: Self-pay | Admitting: *Deleted

## 2022-08-03 NOTE — Telephone Encounter (Signed)
Post ED Visit - Positive Culture Follow-up  Culture report reviewed by antimicrobial stewardship pharmacist: [name] []  Enzo Bi, Pharm.D. []  Celedonio Miyamoto, Pharm.D., BCPS AQ-ID []  Garvin Fila, Pharm.D., BCPS []  Georgina Pillion, Pharm.D., BCPS []  [place], [address].D., BCPS, AAHIVP []  Estella Husk, Pharm.D., BCPS, AAHIVP []  [name], PharmD, BCPS []  Phillips Climes, PharmD, BCPS []  Agapito Games, PharmD, BCPS []  Verlan Friends, PharmD []  Mervyn Gay, PharmD, BCPS [x]  Delmar Landau, PharmD  [facility] []  Len Childs, PharmD []  Greer Pickerel, PharmD []  Adalberto Cole, PharmD []  Perlie Gold, Rph []  Lonell Face) Jean Rosenthal, PharmD []  Earl Many, PharmD []  Junita Push, PharmD []  Dorna Leitz, PharmD []  Terrilee Files, PharmD []  Lynann Beaver, PharmD []  Keturah Barre, PharmD []  Loralee Pacas, PharmD []  Bernadene Person, PharmD   Positive urine culture Treated with Cephalexin, organism sensitive to the same and no further patient follow-up is required at this time.  Patsey Berthold 08/03/2022, 12:10 PM

## 2022-10-15 ENCOUNTER — Other Ambulatory Visit (HOSPITAL_BASED_OUTPATIENT_CLINIC_OR_DEPARTMENT_OTHER): Payer: Self-pay

## 2023-01-26 ENCOUNTER — Other Ambulatory Visit (HOSPITAL_BASED_OUTPATIENT_CLINIC_OR_DEPARTMENT_OTHER): Payer: Self-pay

## 2023-03-01 ENCOUNTER — Encounter (HOSPITAL_COMMUNITY): Payer: Self-pay

## 2023-03-01 ENCOUNTER — Ambulatory Visit (HOSPITAL_COMMUNITY)
Admission: EM | Admit: 2023-03-01 | Discharge: 2023-03-01 | Disposition: A | Discharge status: Home / Self Care | Payer: Medicaid Other | Attending: Internal Medicine | Admitting: Internal Medicine

## 2023-03-01 DIAGNOSIS — N898 Other specified noninflammatory disorders of vagina: Secondary | ICD-10-CM | POA: Diagnosis not present

## 2023-03-01 LAB — POCT URINALYSIS DIP (MANUAL ENTRY)
Bilirubin, UA: NEGATIVE
Blood, UA: NEGATIVE
Glucose, UA: NEGATIVE mg/dL
Ketones, POC UA: NEGATIVE mg/dL
Leukocytes, UA: NEGATIVE
Nitrite, UA: NEGATIVE
Protein Ur, POC: NEGATIVE mg/dL
Spec Grav, UA: 1.015 (ref 1.010–1.025)
Urobilinogen, UA: 0.2 U/dL
pH, UA: 6 (ref 5.0–8.0)

## 2023-03-01 LAB — POCT URINE PREGNANCY: Preg Test, Ur: NEGATIVE

## 2023-03-01 MED ORDER — METRONIDAZOLE 500 MG PO TABS: 500.0000 mg | ORAL_TABLET | Freq: Two times a day (BID) | ORAL | 0 refills | Status: DC

## 2023-03-01 NOTE — Discharge Instructions (Addendum)
We are treating you for bacterial vaginosis given the symptoms and recurrence in the past.  Take metronidazole twice daily for 7 days.  Do not drink any alcohol with this medication for 3 days after completing course as it will cause you to vomit.  We will contact you if we need to arrange additional treatment based on your testing. Abstain from sex until you receive your final results.  Use a condom for the sexual encounter.  Use hypoallergenic soaps and detergents and wear loosefitting cotton underwear.  May want to check condoms and lubrication for additives such as latex which can be an irritant.  May want to consider taking a probiotic or eating yogurt daily.  Recommend following up with a gynecologist due to recurrent nature of the BV.  If you have any worsening or changing symptoms including abnormal discharge, pelvic pain, abdominal pain, fever, nausea, vomiting you should be reevaluated.

## 2023-03-01 NOTE — ED Provider Notes (Signed)
[facility]    CSN: [id number] Arrival date & time: 03/01/23  1012      History   Chief Complaint Chief Complaint  Patient presents with   Urinary Frequency    HPI Debra Rosario is a 38 y.o. female.   38 year old female who presents urgent care with complaints of vaginal discharge, irritation and urinary frequency.  This started a little over 24 hours ago.  She is also having lower abdominal cramping.  The discharge is clumpy in nature.  She has had this numerous times and has been diagnosed with bacterial vaginitis several times.  She has been with the same partner for 3 years and is not concerned for STIs.  She has a tendency to get urinary tract infections when she gets bacterial vaginitis.  She had a Pap smear about a year ago that was normal.  This is done by her family medicine doctor.  She does not have a gynecologist.  She denies fevers, chills, diarrhea or other symptoms.     Urinary Frequency Pertinent negatives include no chest pain, no abdominal pain and no shortness of breath.    History reviewed. No pertinent past medical history.  Patient Active Problem List   Diagnosis Date Noted   Pap smear for cervical cancer screening 12/28/2020   Breast lump on left side at 6 o'clock position 11/29/2020   DYSURIA 10/19/2007    Past Surgical History:  Procedure Laterality Date   CESAREAN SECTION     TUBAL LIGATION      OB History     Gravida  3   Para  3   Term  3   Preterm  0   AB  0   Living  3      SAB  0   IAB  0   Ectopic  0   Multiple  0   Live Births  3            Home Medications    Prior to Admission medications   Not on File    Family History Family History  Family history unknown: Yes    Social History Social History   Tobacco Use   Smoking status: Never    Passive exposure: Never   Smokeless tobacco: Never  Vaping Use   Vaping status: Never Used  Substance Use Topics   Alcohol use: Yes     Comment: occ   Drug use: Not Currently    Frequency: 1.0 times per week    Types: Marijuana     Allergies   Patient has no known allergies.   Review of Systems Review of Systems  Constitutional:  Negative for chills and fever.  HENT:  Negative for ear pain and sore throat.   Eyes:  Negative for pain and visual disturbance.  Respiratory:  Negative for cough and shortness of breath.   Cardiovascular:  Negative for chest pain and palpitations.  Gastrointestinal:  Negative for abdominal pain and vomiting.  Genitourinary:  Positive for frequency and vaginal discharge. Negative for dysuria and hematuria.  Musculoskeletal:  Negative for arthralgias and back pain.  Skin:  Negative for color change and rash.  Neurological:  Negative for seizures and syncope.  All other systems reviewed and are negative.    Physical Exam Triage Vital Signs ED Triage Vitals  Encounter Vitals Group     BP 03/01/23 1027 121/80     Systolic BP Percentile --      Diastolic BP Percentile --  Pulse Rate 03/01/23 1027 83     Resp 03/01/23 1027 16     Temp 03/01/23 1027 98.7 F (37.1 C)     Temp Source 03/01/23 1027 Oral     SpO2 03/01/23 1027 98 %     Weight 03/01/23 1028 164 lb (74.4 kg)     Height 03/01/23 1028 5\' 2"  (1.575 m)     Head Circumference --      Peak Flow --      Pain Score 03/01/23 1028 4     Pain Loc --      Pain Education --      Exclude from Growth Chart --    No data found.  Updated Vital Signs BP 121/80 (BP Location: Left Arm)   Pulse 83   Temp 98.7 F (37.1 C) (Oral)   Resp 16   Ht 5\' 2"  (1.575 m)   Wt 164 lb (74.4 kg)   LMP 02/03/2023 (Exact Date)   SpO2 98%   BMI 30.00 kg/m   Visual Acuity Right Eye Distance:   Left Eye Distance:   Bilateral Distance:    Right Eye Near:   Left Eye Near:    Bilateral Near:     Physical Exam Vitals and nursing note reviewed.  Constitutional:      General: She is not in acute distress.    Appearance: She is  well-developed.  HENT:     Head: Normocephalic and atraumatic.  Eyes:     Conjunctiva/sclera: Conjunctivae normal.  Cardiovascular:     Rate and Rhythm: Normal rate and regular rhythm.  Pulmonary:     Effort: Pulmonary effort is normal. No respiratory distress.  Abdominal:     Palpations: Abdomen is soft.     Tenderness: There is abdominal tenderness.  Musculoskeletal:        General: No swelling.     Cervical back: Neck supple.  Skin:    General: Skin is warm and dry.     Capillary Refill: Capillary refill takes less than 2 seconds.  Neurological:     Mental Status: She is alert.  Psychiatric:        Mood and Affect: Mood normal.      UC Treatments / Results  Labs (all labs ordered are listed, but only abnormal results are displayed) Labs Reviewed  POCT URINALYSIS DIP (MANUAL ENTRY)  POCT URINE PREGNANCY  CERVICOVAGINAL ANCILLARY ONLY    EKG   Radiology No results found.  Procedures Procedures (including critical care time)  Medications Ordered in UC Medications - No data to display  Initial Impression / Assessment and Plan / UC Course  I have reviewed the triage vital signs and the nursing notes.  Pertinent labs & imaging results that were available during my care of the patient were reviewed by me and considered in my medical decision making (see chart for details).     Vaginal discharge  We are treating you for bacterial vaginosis given the symptoms and recurrence in the past.  Take metronidazole twice daily for 7 days.  Do not drink any alcohol with this medication for 3 days after completing course as it will cause you to vomit.  We will contact you if we need to arrange additional treatment based on your testing. Abstain from sex until you receive your final results.  Use a condom for the sexual encounter.  Use hypoallergenic soaps and detergents and wear loosefitting cotton underwear.  May want to check condoms and lubrication for additives such  as  latex which can be an irritant.  May want to consider taking a probiotic or eating yogurt daily.  Recommend following up with a gynecologist due to recurrent nature of the BV.  If you have any worsening or changing symptoms including abnormal discharge, pelvic pain, abdominal pain, fever, nausea, vomiting you should be reevaluated.    Final Clinical Impressions(s) / UC Diagnoses   Final diagnoses:  None   Discharge Instructions   None    ED Prescriptions   None    PDMP not reviewed this encounter.   Landis Martins, [place] 03/01/23 1111

## 2023-03-01 NOTE — ED Triage Notes (Signed)
Patient here today with c/o frequent urination and vaginal discharge after urinating X 1 day. Patient would like to be tested for BV.

## 2023-03-02 LAB — CERVICOVAGINAL ANCILLARY ONLY
Bacterial Vaginitis (gardnerella): POSITIVE — AB
Chlamydia: NEGATIVE
Comment: NEGATIVE
Comment: NEGATIVE
Comment: NEGATIVE
Comment: NORMAL
Neisseria Gonorrhea: NEGATIVE
Trichomonas: NEGATIVE

## 2023-04-20 ENCOUNTER — Encounter: Payer: Medicaid Other | Admitting: Obstetrics and Gynecology

## 2023-07-21 ENCOUNTER — Encounter: Payer: Self-pay | Admitting: *Deleted

## 2023-11-09 IMAGING — DX DG KNEE COMPLETE 4+V*L*
3 series · 4 of 4 positions shown · non-contrast
Comparison: None.

CLINICAL DATA: Left knee pain after injury.

EXAM:
LEFT KNEE - COMPLETE 4+ VIEW

[knee ap]
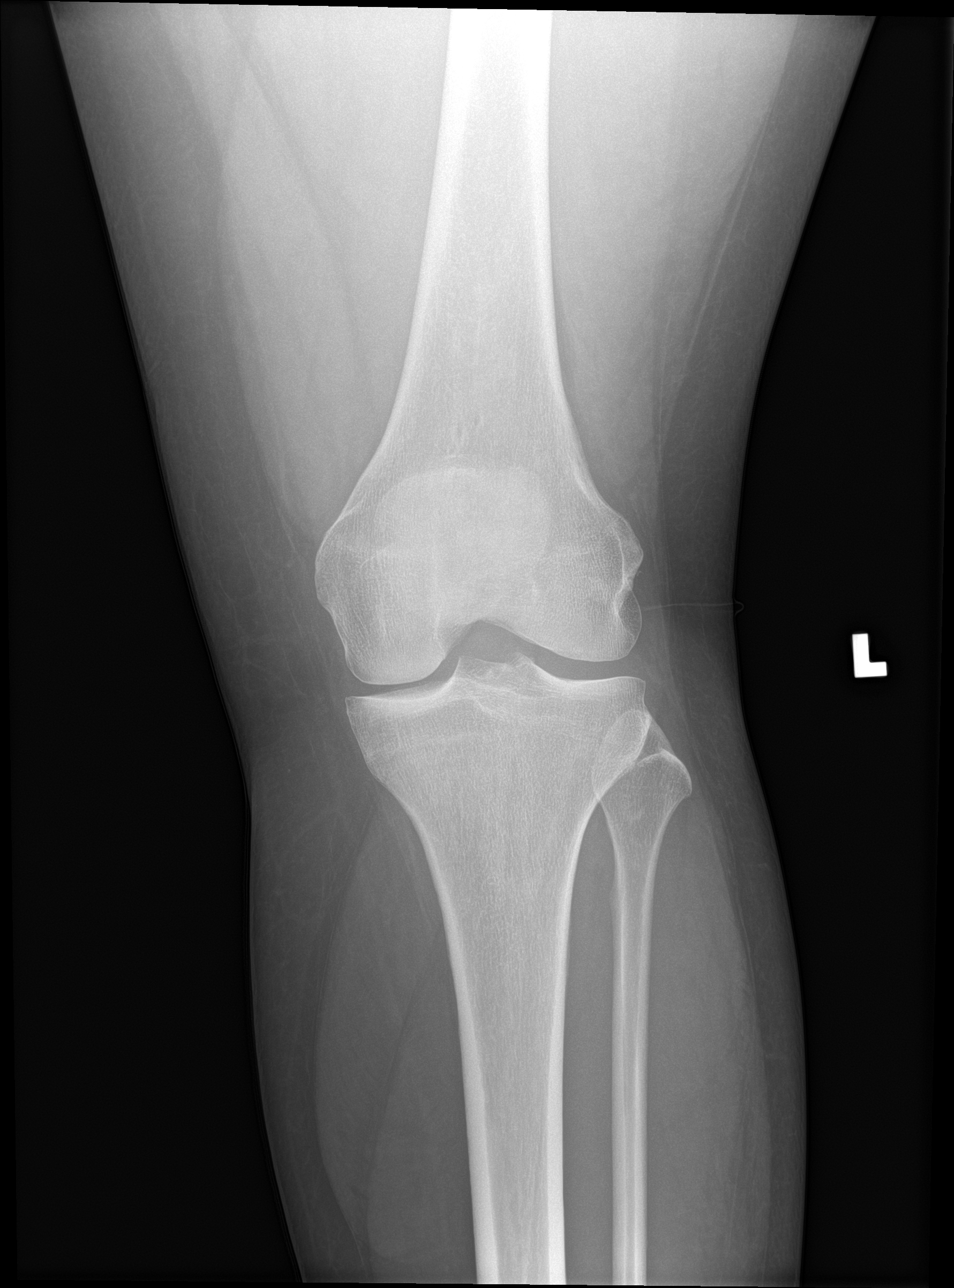

[knee lat]
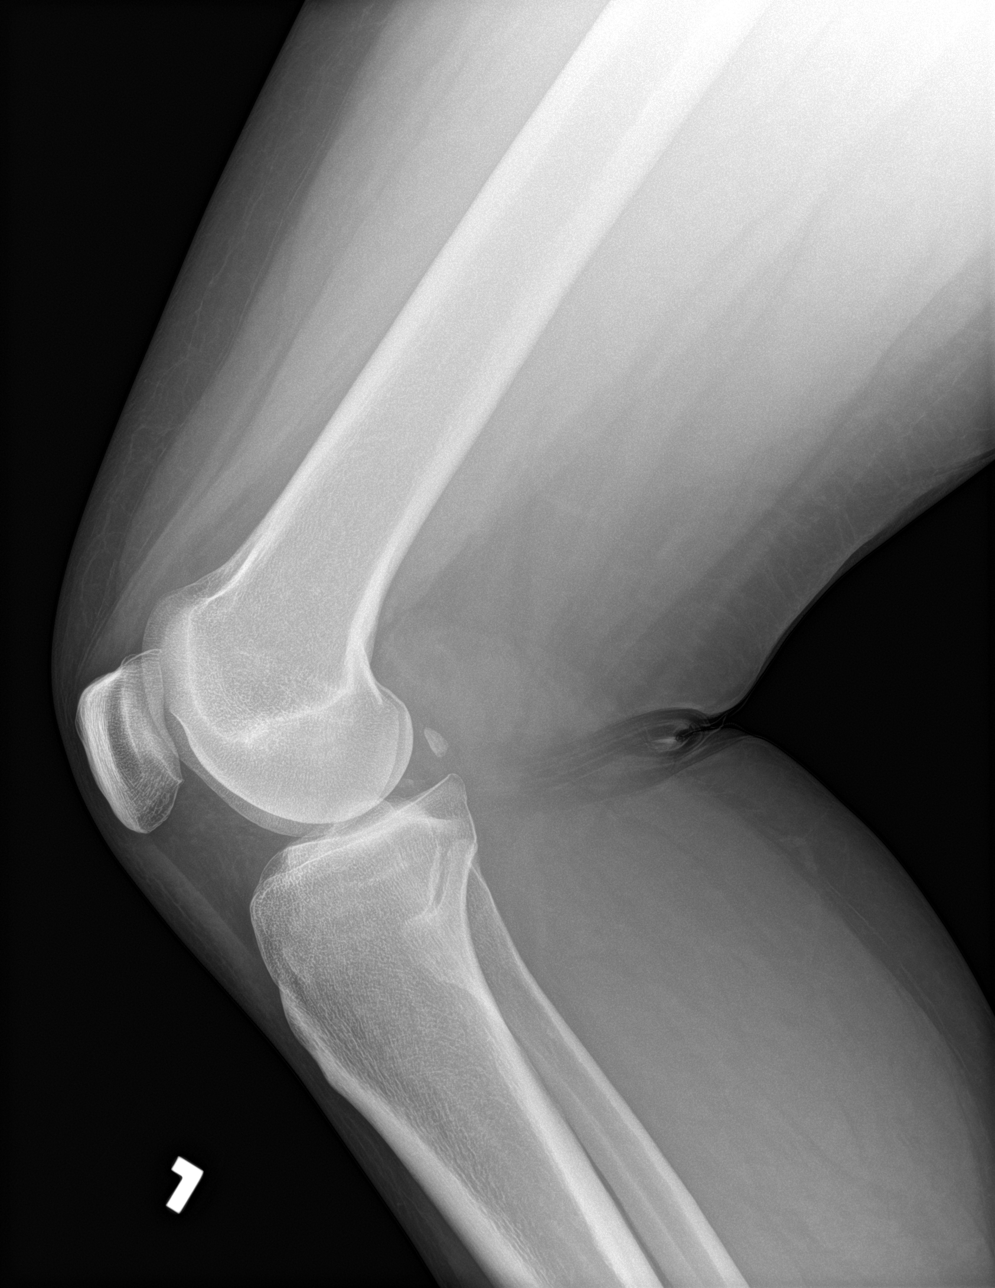

[Series 5: knee obl · 0.14mm/px · 2 of 2 slices shown]
[im 1/2]
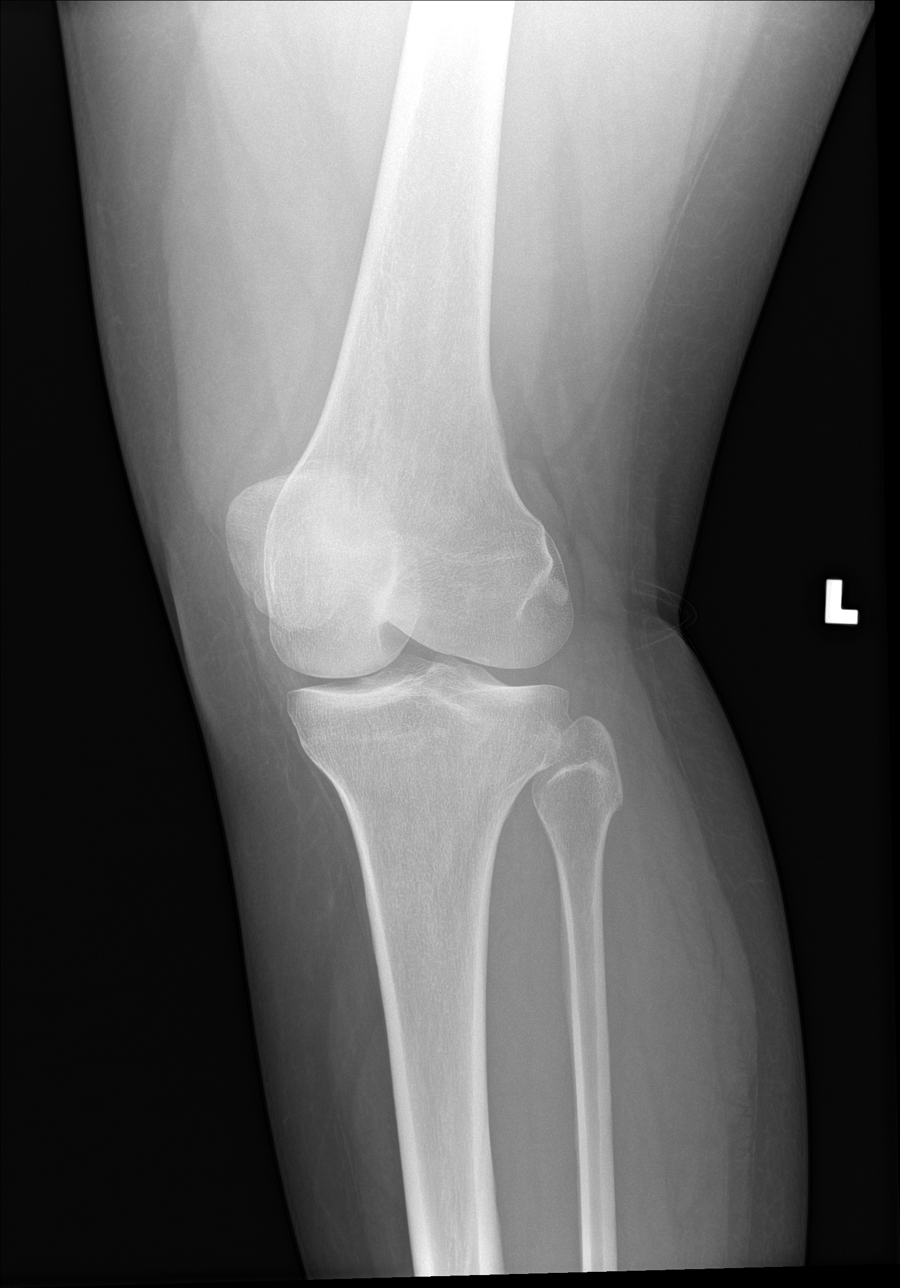
[im 2/2]
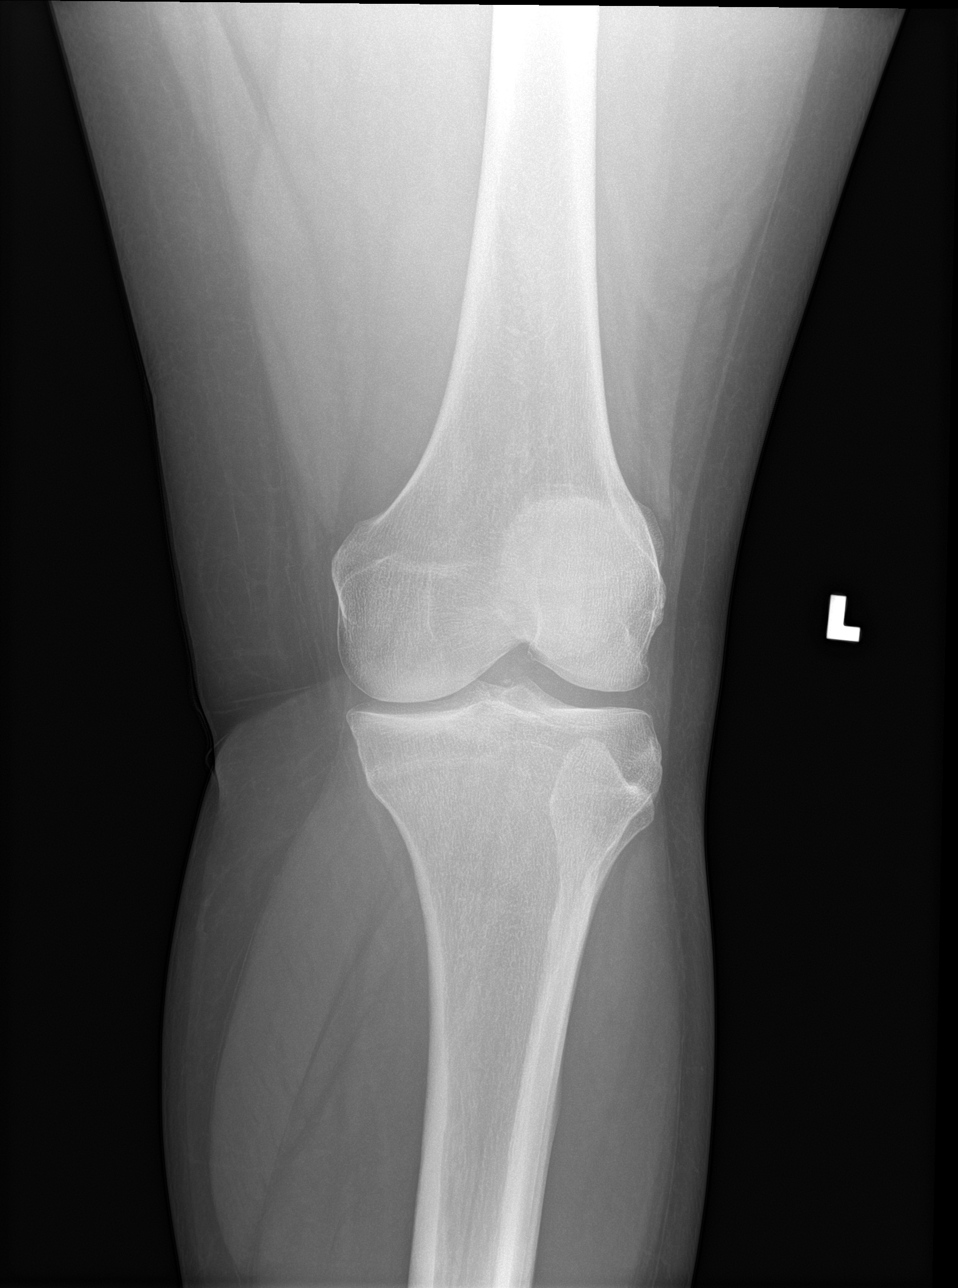

[4 of 4 positions shown; findings below may reference images not displayed]

FINDINGS: Four view study. No fracture or subluxation. No joint effusion
appeared mild loss of joint space medial compartment with trace
spurring in the patellofemoral compartment.
IMPRESSION: Mild degenerative changes without acute bony findings.

## 2023-12-07 ENCOUNTER — Encounter: Admitting: Obstetrics and Gynecology

## 2024-01-17 ENCOUNTER — Other Ambulatory Visit: Payer: Self-pay

## 2024-01-17 ENCOUNTER — Ambulatory Visit (HOSPITAL_COMMUNITY)
Admission: EM | Admit: 2024-01-17 | Discharge: 2024-01-17 | Disposition: A | Discharge status: Home / Self Care | Attending: Physician Assistant | Admitting: Physician Assistant

## 2024-01-17 ENCOUNTER — Encounter (HOSPITAL_COMMUNITY): Payer: Self-pay | Admitting: *Deleted

## 2024-01-17 ENCOUNTER — Ambulatory Visit (HOSPITAL_COMMUNITY): Payer: Self-pay | Admitting: Physician Assistant

## 2024-01-17 DIAGNOSIS — N3001 Acute cystitis with hematuria: Secondary | ICD-10-CM

## 2024-01-17 DIAGNOSIS — N898 Other specified noninflammatory disorders of vagina: Secondary | ICD-10-CM

## 2024-01-17 LAB — POCT URINALYSIS DIP (MANUAL ENTRY)
Bilirubin, UA: NEGATIVE
Glucose, UA: NEGATIVE mg/dL
Ketones, POC UA: NEGATIVE mg/dL
Leukocytes, UA: NEGATIVE
Nitrite, UA: POSITIVE — AB
Protein Ur, POC: NEGATIVE mg/dL
Spec Grav, UA: 1.02 (ref 1.010–1.025)
Urobilinogen, UA: 0.2 U/dL
pH, UA: 7 (ref 5.0–8.0)

## 2024-01-17 LAB — BASIC METABOLIC PANEL WITH GFR
Anion gap: 9 (ref 5–15)
BUN: 9 mg/dL (ref 6–20)
CO2: 26 mmol/L (ref 22–32)
Calcium: 9.2 mg/dL (ref 8.9–10.3)
Chloride: 105 mmol/L (ref 98–111)
Creatinine, Ser: 1.02 mg/dL — ABNORMAL HIGH (ref 0.44–1.00)
GFR, Estimated: >60 mL/min (ref >60)
Glucose, Bld: 75 mg/dL (ref 70–99)
Potassium: 4.2 mmol/L (ref 3.5–5.1)
Sodium: 140 mmol/L (ref 135–145)

## 2024-01-17 MED ORDER — STERILE WATER FOR INJECTION IJ SOLN
INTRAMUSCULAR | Status: AC
  Filled 2024-01-17: qty 10

## 2024-01-17 MED ORDER — CEFTRIAXONE SODIUM 1 G IJ SOLR
INTRAMUSCULAR | Status: AC
  Filled 2024-01-17: qty 10

## 2024-01-17 MED ORDER — CEFTRIAXONE SODIUM 1 G IJ SOLR
1.0000 g | Freq: Once | INTRAMUSCULAR | Status: AC
  Administered 2024-01-17: 1 g via INTRAMUSCULAR

## 2024-01-17 MED ORDER — NITROFURANTOIN MONOHYD MACRO 100 MG PO CAPS: 100.0000 mg | ORAL_CAPSULE | Freq: Two times a day (BID) | ORAL | 0 refills | Status: AC

## 2024-01-17 NOTE — ED Triage Notes (Addendum)
 Pt reports she went to [facility] 5 days ago and was treated for UTI with Anti-Bx. Pt continues to have same Sx's . Pt believe's the anti-bx did not work. Pt reports urinary frequency ,odor to urine and urine is cloudy.

## 2024-01-17 NOTE — Discharge Instructions (Addendum)
 We gave an injection of an antibiotic today to cover for a urinary tract infection I would also like you to start nitrofurantoin  twice daily for 5 days.  This will change the color of your urine.  I will contact you if your culture indicates that we need to change or stop your antibiotics.  Continue drinking plenty of fluid.  If you continue to have recurrent symptoms I do recommend you follow-up with urology; call to schedule an appointment.  If anything worsens and you have high fever, abdominal pain, pelvic pain, nausea/vomiting you need to be seen immediately.  We will contact you if anything is positive on your swab.  If you develop any significant symptoms please return for reevaluation.

## 2024-01-17 NOTE — ED Provider Notes (Signed)
 [facility]    CSN: [id number] Arrival date & time: 01/17/24  9195      History   Chief Complaint Chief Complaint  Patient presents with   Recurrent UTI    HPI Debra Rosario is a 38 y.o. female.   Patient presents today with a weeklong history of persistent UTI symptoms.  She reports that last week she went to a different urgent care where she was diagnosed with UTI and started on Bactrim  DS twice daily for 5 days.  She reports that her symptoms did improve but never completely resolved and then have recurred after she stopped the medication.  She has had UTIs in the past with similar presentation.  Reports associated malodorous urine, urinary frequency and urgency.  She is not experiencing any pelvic pain, abdominal pain, fever, nausea, vomiting.  She has had some vaginal discharge and she is interested in testing for STIs and BV.  She denies any recent vaginal procedure, self-catheterization.  Denies history of diabetes and does not take initially 2 inhibitor.  She is confident that she is not pregnant.  Denies any history of nephrolithiasis    History reviewed. No pertinent past medical history.  Patient Active Problem List   Diagnosis Date Noted   Pap smear for cervical cancer screening 12/28/2020   Breast lump on left side at 6 o'clock position 11/29/2020   DYSURIA 10/19/2007    Past Surgical History:  Procedure Laterality Date   CESAREAN SECTION     TUBAL LIGATION      OB History     Gravida  3   Para  3   Term  3   Preterm  0   AB  0   Living  3      SAB  0   IAB  0   Ectopic  0   Multiple  0   Live Births  3            Home Medications    Prior to Admission medications   Medication Sig Start Date End Date Taking? Authorizing Provider  nitrofurantoin , macrocrystal-monohydrate, (MACROBID ) 100 MG capsule Take 1 capsule (100 mg total) by mouth 2 (two) times daily. 01/17/24  Yes Anquan Azzarello, Rocky POUR, PA-C    Family History Family  History  Family history unknown: Yes    Social History Social History   Tobacco Use   Smoking status: Never    Passive exposure: Never   Smokeless tobacco: Never  Vaping Use   Vaping status: Never Used  Substance Use Topics   Alcohol use: Yes    Comment: occ   Drug use: Not Currently    Frequency: 1.0 times per week    Types: Marijuana     Allergies   Patient has no known allergies.   Review of Systems Review of Systems  Constitutional:  Positive for activity change. Negative for appetite change, fatigue and fever.  Gastrointestinal:  Negative for abdominal pain, diarrhea, nausea and vomiting.  Genitourinary:  Positive for frequency, urgency and vaginal discharge. Negative for dysuria, flank pain, hematuria, pelvic pain, vaginal bleeding and vaginal pain.     Physical Exam Triage Vital Signs ED Triage Vitals  Encounter Vitals Group     BP 01/17/24 0826 132/81     Girls Systolic BP Percentile --      Girls Diastolic BP Percentile --      Boys Systolic BP Percentile --      Boys Diastolic BP Percentile --  Pulse Rate 01/17/24 0826 74     Resp 01/17/24 0826 18     Temp 01/17/24 0826 98.8 F (37.1 C)     Temp src --      SpO2 01/17/24 0826 98 %     Weight --      Height --      Head Circumference --      Peak Flow --      Pain Score 01/17/24 0824 0     Pain Loc --      Pain Education --      Exclude from Growth Chart --    No data found.  Updated Vital Signs BP 132/81   Pulse 74   Temp 98.8 F (37.1 C)   Resp 18   LMP 01/02/2024   SpO2 98%   Visual Acuity Right Eye Distance:   Left Eye Distance:   Bilateral Distance:    Right Eye Near:   Left Eye Near:    Bilateral Near:     Physical Exam Vitals reviewed.  Constitutional:      General: She is awake. She is not in acute distress.    Appearance: Normal appearance. She is well-developed. She is not ill-appearing.     Comments: Very pleasant female appears in today to no acute distress  sitting comfortably in exam room  HENT:     Head: Normocephalic and atraumatic.  Cardiovascular:     Rate and Rhythm: Normal rate and regular rhythm.     Heart sounds: Normal heart sounds, S1 normal and S2 normal. No murmur heard. Pulmonary:     Effort: Pulmonary effort is normal.     Breath sounds: Normal breath sounds. No wheezing, rhonchi or rales.     Comments: Clear to auscultation bilaterally Abdominal:     General: Bowel sounds are normal.     Palpations: Abdomen is soft.     Tenderness: There is no abdominal tenderness. There is no right CVA tenderness, left CVA tenderness, guarding or rebound.     Comments: Benign abdominal exam  Psychiatric:        Behavior: Behavior is cooperative.      UC Treatments / Results  Labs (all labs ordered are listed, but only abnormal results are displayed) Labs Reviewed  POCT URINALYSIS DIP (MANUAL ENTRY) - Abnormal; Notable for the following components:      Result Value   Clarity, UA turbid (*)    Blood, UA trace-intact (*)    Nitrite, UA Positive (*)    All other components within normal limits  URINE CULTURE  BASIC METABOLIC PANEL WITH GFR  CERVICOVAGINAL ANCILLARY ONLY    EKG   Radiology No results found.  Procedures Procedures (including critical care time)  Medications Ordered in UC Medications  cefTRIAXone  (ROCEPHIN ) injection 1 g (1 g Intramuscular Given 01/17/24 0842)    Initial Impression / Assessment and Plan / UC Course  I have reviewed the triage vital signs and the nursing notes.  Pertinent labs & imaging results that were available during my care of the patient were reviewed by me and considered in my medical decision making (see chart for details).     Patient is well-appearing, afebrile, nontoxic, nontachycardic.  UA is positive for nitrites and has trace blood spoke with associated symptoms will treat empirically for UTI.  She was given 1 g of Rocephin  in clinic given her recent antibiotic use and  persistent symptoms.  Will start nitrofurantoin  twice daily for 5 days.  She  does not have any recent kidney function in epic but denies any history of CKD or significant comorbidities that would impact her kidney function.  Will obtain a BMP and contact her if we need to adjust her dose based on results.  Will send her urine for culture and contact her if we need to change her antibiotics based on culture results.  Given her recurrent symptoms we discussed that if this continues to be a recurrent problem she should follow-up with urology and was given the contact information for local provider with instruction to call to schedule an appointment.  If anything worsens and she has high fever, abdominal pain, pelvic pain, fever, nausea, vomiting she needs to be seen immediately.  Patient had some vaginal discharge.  Swab was collected today and is pending.  We will contact her if this is positive and changes our treatment plan.  If she develops any additional symptoms she is to return for reevaluation.  Patient expressed understanding and agreement treatment plan.  Final Clinical Impressions(s) / UC Diagnoses   Final diagnoses:  Acute cystitis with hematuria  Vaginal discharge     Discharge Instructions      We gave an injection of an antibiotic today to cover for a urinary tract infection I would also like you to start nitrofurantoin  twice daily for 5 days.  This will change the color of your urine.  I will contact you if your culture indicates that we need to change or stop your antibiotics.  Continue drinking plenty of fluid.  If you continue to have recurrent symptoms I do recommend you follow-up with urology; call to schedule an appointment.  If anything worsens and you have high fever, abdominal pain, pelvic pain, nausea/vomiting you need to be seen immediately.  We will contact you if anything is positive on your swab.  If you develop any significant symptoms please return for  reevaluation.     ED Prescriptions     Medication Sig Dispense Auth. Provider   nitrofurantoin , macrocrystal-monohydrate, (MACROBID ) 100 MG capsule Take 1 capsule (100 mg total) by mouth 2 (two) times daily. 10 capsule Lucky Alverson K, PA-C      PDMP not reviewed this encounter.   Sherrell Rocky POUR, PA-C 01/17/24 9097

## 2024-01-18 LAB — CERVICOVAGINAL ANCILLARY ONLY
Bacterial Vaginitis (gardnerella): POSITIVE — AB
Candida Glabrata: NEGATIVE
Candida Vaginitis: NEGATIVE
Chlamydia: NEGATIVE
Comment: NEGATIVE
Comment: NEGATIVE
Comment: NEGATIVE
Comment: NEGATIVE
Comment: NEGATIVE
Comment: NORMAL
Neisseria Gonorrhea: NEGATIVE
Trichomonas: NEGATIVE

## 2024-01-18 MED ORDER — METRONIDAZOLE 500 MG PO TABS: 500.0000 mg | ORAL_TABLET | Freq: Two times a day (BID) | ORAL | 0 refills | Status: AC

## 2024-01-18 MED ORDER — METRONIDAZOLE 0.75 % VA GEL: 1.0000 | Freq: Every day | VAGINAL | 0 refills | Status: AC

## 2024-01-19 LAB — URINE CULTURE: Culture: >=100000 — AB
